# Patient Record
Sex: Male | Born: 1960 | Race: Black or African American | Hispanic: No | Smoking: Never smoker
Health system: Southern US, Community
[De-identification: ages and names within clinical notes are randomized; demographics above are authoritative.]

## PROBLEM LIST (undated history)

## (undated) DIAGNOSIS — K859 Acute pancreatitis without necrosis or infection, unspecified: Secondary | ICD-10-CM

## (undated) DIAGNOSIS — K219 Gastro-esophageal reflux disease without esophagitis: Secondary | ICD-10-CM

## (undated) HISTORY — DX: Acute pancreatitis without necrosis or infection, unspecified: K85.90

## (undated) HISTORY — DX: Gastro-esophageal reflux disease without esophagitis: K21.9

---

## 1996-12-06 HISTORY — PX: LEG SURGERY: SHX1003

## 2003-01-15 ENCOUNTER — Emergency Department (HOSPITAL_COMMUNITY): Admission: EM | Admit: 2003-01-15 | Discharge: 2003-01-15 | Payer: Self-pay | Admitting: Emergency Medicine

## 2005-01-07 ENCOUNTER — Ambulatory Visit: Payer: Self-pay | Admitting: Internal Medicine

## 2010-12-24 ENCOUNTER — Emergency Department (HOSPITAL_COMMUNITY)
Admission: EM | Admit: 2010-12-24 | Discharge: 2010-12-24 | Payer: Self-pay | Source: Home / Self Care | Admitting: Emergency Medicine

## 2010-12-28 ENCOUNTER — Emergency Department (HOSPITAL_COMMUNITY)
Admission: EM | Admit: 2010-12-28 | Discharge: 2010-12-28 | Payer: Self-pay | Source: Home / Self Care | Admitting: Emergency Medicine

## 2011-05-31 ENCOUNTER — Inpatient Hospital Stay (INDEPENDENT_AMBULATORY_CARE_PROVIDER_SITE_OTHER)
Admission: RE | Admit: 2011-05-31 | Discharge: 2011-05-31 | Disposition: A | Discharge status: Home / Self Care | Payer: No Typology Code available for payment source | Source: Ambulatory Visit | Attending: Family Medicine | Admitting: Family Medicine

## 2011-05-31 DIAGNOSIS — M545 Low back pain: Secondary | ICD-10-CM

## 2011-09-25 ENCOUNTER — Emergency Department (HOSPITAL_COMMUNITY)
Admission: EM | Admit: 2011-09-25 | Discharge: 2011-09-25 | Disposition: A | Discharge status: Home / Self Care | Payer: Self-pay | Attending: Emergency Medicine | Admitting: Emergency Medicine

## 2011-09-25 ENCOUNTER — Emergency Department (HOSPITAL_COMMUNITY): Payer: Self-pay

## 2011-09-25 DIAGNOSIS — M25519 Pain in unspecified shoulder: Secondary | ICD-10-CM | POA: Insufficient documentation

## 2011-09-25 DIAGNOSIS — M549 Dorsalgia, unspecified: Secondary | ICD-10-CM | POA: Insufficient documentation

## 2011-09-25 DIAGNOSIS — Y92410 Unspecified street and highway as the place of occurrence of the external cause: Secondary | ICD-10-CM | POA: Insufficient documentation

## 2011-09-25 DIAGNOSIS — T1490XA Injury, unspecified, initial encounter: Secondary | ICD-10-CM | POA: Insufficient documentation

## 2011-10-04 ENCOUNTER — Emergency Department (HOSPITAL_COMMUNITY)
Admission: EM | Admit: 2011-10-04 | Discharge: 2011-10-04 | Disposition: A | Discharge status: Home / Self Care | Payer: Self-pay | Attending: Emergency Medicine | Admitting: Emergency Medicine

## 2011-10-04 DIAGNOSIS — M25519 Pain in unspecified shoulder: Secondary | ICD-10-CM | POA: Insufficient documentation

## 2011-10-04 DIAGNOSIS — IMO0002 Reserved for concepts with insufficient information to code with codable children: Secondary | ICD-10-CM | POA: Insufficient documentation

## 2016-07-07 ENCOUNTER — Emergency Department (HOSPITAL_COMMUNITY)
Admission: EM | Admit: 2016-07-07 | Discharge: 2016-07-07 | Disposition: A | Discharge status: Home / Self Care | Payer: BLUE CROSS/BLUE SHIELD | Attending: Emergency Medicine | Admitting: Emergency Medicine

## 2016-07-07 ENCOUNTER — Emergency Department (HOSPITAL_COMMUNITY): Payer: BLUE CROSS/BLUE SHIELD

## 2016-07-07 ENCOUNTER — Encounter (HOSPITAL_COMMUNITY): Payer: Self-pay | Admitting: Emergency Medicine

## 2016-07-07 DIAGNOSIS — Y939 Activity, unspecified: Secondary | ICD-10-CM | POA: Diagnosis not present

## 2016-07-07 DIAGNOSIS — M542 Cervicalgia: Secondary | ICD-10-CM | POA: Diagnosis present

## 2016-07-07 DIAGNOSIS — Y999 Unspecified external cause status: Secondary | ICD-10-CM | POA: Diagnosis not present

## 2016-07-07 DIAGNOSIS — Y92410 Unspecified street and highway as the place of occurrence of the external cause: Secondary | ICD-10-CM | POA: Insufficient documentation

## 2016-07-07 MED ORDER — IBUPROFEN 600 MG PO TABS: 600.0000 mg | ORAL_TABLET | Freq: Four times a day (QID) | ORAL | 0 refills | Status: DC | PRN

## 2016-07-07 MED ORDER — METHOCARBAMOL 500 MG PO TABS: 500.0000 mg | ORAL_TABLET | Freq: Two times a day (BID) | ORAL | 0 refills | Status: DC | PRN
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
## 2019-09-08 ENCOUNTER — Inpatient Hospital Stay (HOSPITAL_COMMUNITY)
Admission: EM | Admit: 2019-09-08 | Discharge: 2019-09-11 | DRG: 871 | Disposition: A | Discharge status: Home / Self Care | Payer: BLUE CROSS/BLUE SHIELD | Attending: General Surgery | Admitting: General Surgery

## 2019-09-08 ENCOUNTER — Emergency Department (HOSPITAL_COMMUNITY): Payer: BLUE CROSS/BLUE SHIELD

## 2019-09-08 ENCOUNTER — Encounter (HOSPITAL_COMMUNITY): Payer: Self-pay | Admitting: *Deleted

## 2019-09-08 ENCOUNTER — Other Ambulatory Visit: Payer: Self-pay

## 2019-09-08 DIAGNOSIS — K3533 Acute appendicitis with perforation and localized peritonitis, with abscess: Secondary | ICD-10-CM | POA: Diagnosis present

## 2019-09-08 DIAGNOSIS — A419 Sepsis, unspecified organism: Secondary | ICD-10-CM | POA: Diagnosis present

## 2019-09-08 DIAGNOSIS — R030 Elevated blood-pressure reading, without diagnosis of hypertension: Secondary | ICD-10-CM | POA: Diagnosis present

## 2019-09-08 DIAGNOSIS — K3532 Acute appendicitis with perforation and localized peritonitis, without abscess: Secondary | ICD-10-CM | POA: Diagnosis present

## 2019-09-08 DIAGNOSIS — K651 Peritoneal abscess: Secondary | ICD-10-CM | POA: Diagnosis present

## 2019-09-08 DIAGNOSIS — Z20828 Contact with and (suspected) exposure to other viral communicable diseases: Secondary | ICD-10-CM | POA: Diagnosis present

## 2019-09-08 LAB — COMPREHENSIVE METABOLIC PANEL
ALT: 27 U/L (ref 0–44)
AST: 22 U/L (ref 15–41)
Albumin: 4.2 g/dL (ref 3.5–5.0)
Alkaline Phosphatase: 90 U/L (ref 38–126)
Anion gap: 14 (ref 5–15)
BUN: 11 mg/dL (ref 6–20)
CO2: 25 mmol/L (ref 22–32)
Calcium: 9.5 mg/dL (ref 8.9–10.3)
Chloride: 97 mmol/L — ABNORMAL LOW (ref 98–111)
Creatinine, Ser: 1.33 mg/dL — ABNORMAL HIGH (ref 0.61–1.24)
GFR calc Af Amer: >60 mL/min (ref >60)
GFR calc non Af Amer: 58 mL/min — ABNORMAL LOW (ref >60)
Glucose, Bld: 139 mg/dL — ABNORMAL HIGH (ref 70–99)
Potassium: 3.5 mmol/L (ref 3.5–5.1)
Sodium: 136 mmol/L (ref 135–145)
Total Bilirubin: 1.1 mg/dL (ref 0.3–1.2)
Total Protein: 8.4 g/dL — ABNORMAL HIGH (ref 6.5–8.1)

## 2019-09-08 LAB — SARS CORONAVIRUS 2 BY RT PCR (HOSPITAL ORDER, PERFORMED IN ~~LOC~~ HOSPITAL LAB): SARS Coronavirus 2: NEGATIVE

## 2019-09-08 LAB — CBC
HCT: 46.9 % (ref 39.0–52.0)
Hemoglobin: 14.9 g/dL (ref 13.0–17.0)
MCH: 29.9 pg (ref 26.0–34.0)
MCHC: 31.8 g/dL (ref 30.0–36.0)
MCV: 94 fL (ref 80.0–100.0)
Platelets: 422 10*3/uL — ABNORMAL HIGH (ref 150–400)
RBC: 4.99 MIL/uL (ref 4.22–5.81)
RDW: 12.5 % (ref 11.5–15.5)
WBC: 16 10*3/uL — ABNORMAL HIGH (ref 4.0–10.5)
nRBC: 0 % (ref 0.0–0.2)

## 2019-09-08 LAB — LIPASE, BLOOD: Lipase: 28 U/L (ref 11–51)

## 2019-09-08 MED ORDER — ONDANSETRON HCL 4 MG/2ML IJ SOLN
4.0000 mg | Freq: Four times a day (QID) | INTRAMUSCULAR | Status: DC | PRN
  Administered 2019-09-09: 4 mg via INTRAVENOUS
  Filled 2019-09-08: qty 2

## 2019-09-08 MED ORDER — MORPHINE SULFATE (PF) 4 MG/ML IV SOLN
4.0000 mg | Freq: Once | INTRAVENOUS | Status: AC
  Administered 2019-09-08: 17:00:00 4 mg via INTRAVENOUS
  Filled 2019-09-08: qty 1

## 2019-09-08 MED ORDER — ONDANSETRON 4 MG PO TBDP: 4.0000 mg | ORAL_TABLET | Freq: Four times a day (QID) | ORAL | Status: DC | PRN

## 2019-09-08 MED ORDER — LACTATED RINGERS IV BOLUS
1000.0000 mL | Freq: Once | INTRAVENOUS | Status: AC
  Administered 2019-09-08: 1000 mL via INTRAVENOUS

## 2019-09-08 MED ORDER — OXYCODONE HCL 5 MG PO TABS: 5.0000 mg | ORAL_TABLET | ORAL | Status: DC | PRN

## 2019-09-08 MED ORDER — ACETAMINOPHEN 325 MG PO TABS
650.0000 mg | ORAL_TABLET | Freq: Four times a day (QID) | ORAL | Status: DC | PRN
  Administered 2019-09-09 – 2019-09-10 (×4): 650 mg via ORAL
  Filled 2019-09-08 (×4): qty 2

## 2019-09-08 MED ORDER — DEXTROSE-NACL 5-0.45 % IV SOLN
INTRAVENOUS | Status: DC
  Administered 2019-09-08 – 2019-09-10 (×4): via INTRAVENOUS

## 2019-09-08 MED ORDER — SODIUM CHLORIDE 0.9% FLUSH: 3.0000 mL | Freq: Once | INTRAVENOUS | Status: DC

## 2019-09-08 MED ORDER — ONDANSETRON HCL 4 MG/2ML IJ SOLN
4.0000 mg | Freq: Once | INTRAMUSCULAR | Status: AC
  Administered 2019-09-08: 4 mg via INTRAVENOUS
  Filled 2019-09-08: qty 2

## 2019-09-08 MED ORDER — MORPHINE SULFATE (PF) 2 MG/ML IV SOLN
2.0000 mg | INTRAVENOUS | Status: DC | PRN
  Administered 2019-09-08 – 2019-09-09 (×4): 2 mg via INTRAVENOUS
  Filled 2019-09-08 (×3): qty 1

## 2019-09-08 MED ORDER — LACTATED RINGERS IV BOLUS
1000.0000 mL | Freq: Once | INTRAVENOUS | Status: AC
  Administered 2019-09-08: 19:00:00 1000 mL via INTRAVENOUS

## 2019-09-08 MED ORDER — LACTATED RINGERS IV SOLN
INTRAVENOUS | Status: DC
  Administered 2019-09-08 – 2019-09-10 (×2): via INTRAVENOUS

## 2019-09-08 MED ORDER — PIPERACILLIN-TAZOBACTAM 3.375 G IVPB
3.3750 g | Freq: Three times a day (TID) | INTRAVENOUS | Status: DC
  Administered 2019-09-09 – 2019-09-11 (×7): 3.375 g via INTRAVENOUS
  Filled 2019-09-08 (×7): qty 50

## 2019-09-08 MED ORDER — SODIUM CHLORIDE (PF) 0.9 % IJ SOLN
INTRAMUSCULAR | Status: AC
  Filled 2019-09-08: qty 50

## 2019-09-08 MED ORDER — ENOXAPARIN SODIUM 40 MG/0.4ML ~~LOC~~ SOLN
40.0000 mg | Freq: Every day | SUBCUTANEOUS | Status: DC
  Administered 2019-09-09 – 2019-09-10 (×3): 40 mg via SUBCUTANEOUS
  Filled 2019-09-08 (×3): qty 0.4

## 2019-09-08 MED ORDER — PIPERACILLIN-TAZOBACTAM 3.375 G IVPB 30 MIN
3.3750 g | Freq: Once | INTRAVENOUS | Status: AC
  Administered 2019-09-08: 21:00:00 3.375 g via INTRAVENOUS
  Filled 2019-09-08: qty 50

## 2019-09-08 MED ORDER — ACETAMINOPHEN 650 MG RE SUPP: 650.0000 mg | Freq: Four times a day (QID) | RECTAL | Status: DC | PRN

## 2019-09-08 MED ORDER — METOPROLOL TARTRATE 5 MG/5ML IV SOLN
5.0000 mg | Freq: Four times a day (QID) | INTRAVENOUS | Status: DC | PRN
  Administered 2019-09-08 – 2019-09-09 (×2): 5 mg via INTRAVENOUS
  Filled 2019-09-08 (×3): qty 5

## 2019-09-08 MED ORDER — ACETAMINOPHEN 500 MG PO TABS
1000.0000 mg | ORAL_TABLET | Freq: Once | ORAL | Status: AC
  Administered 2019-09-08: 1000 mg via ORAL
  Filled 2019-09-08: qty 2

## 2019-09-08 MED ORDER — DIPHENHYDRAMINE HCL 25 MG PO CAPS: 25.0000 mg | ORAL_CAPSULE | Freq: Four times a day (QID) | ORAL | Status: DC | PRN

## 2019-09-08 MED ORDER — DIPHENHYDRAMINE HCL 50 MG/ML IJ SOLN: 25.0000 mg | Freq: Four times a day (QID) | INTRAMUSCULAR | Status: DC | PRN

## 2019-09-08 MED ORDER — IOHEXOL 300 MG/ML  SOLN
100.0000 mL | Freq: Once | INTRAMUSCULAR | Status: AC | PRN
  Administered 2019-09-08: 100 mL via INTRAVENOUS

## 2019-09-08 MED ORDER — MORPHINE SULFATE (PF) 2 MG/ML IV SOLN
INTRAVENOUS | Status: AC
  Administered 2019-09-08: 21:00:00 2 mg via INTRAVENOUS
  Filled 2019-09-08: qty 1

## 2019-09-08 NOTE — Consult Note (Signed)
Reason for Consult:Appendicitis Referring Physician: Dr. Gwyneth SproutWhitney Plunkett  Darolyn RuaRickey Ewing is an 58 y.o. male.  HPI:   Mr. Charm BargesButler is a previously healthy 58 yo male who presented to the ED with a 4 day history of abdominal pain.  He notes that the pain began as generalized abdominal pain on Tuesday 09/04/2019.  He took one dose of ibuprofen on Tuesday, and notes that his pain improved with that treatment.  On Thursday, his abdominal pain became localized to the RLQ and became progressively worse, inhibiting his ability to sleep.  Associated symptoms included chills, nausea, and decreased appetite.  He denies vomiting, constipation or diarrhea.    ED workup demonstrated a elevated WBC of 16.  In addition, abdominal CT appreciated appendicoliths and perforated acute appendicitis with poorly marginated 7.5 x 4.9 x 4.3 cm periappendiceal abscess in RLQ.    History reviewed. No pertinent past medical history.  He has a history of orthopedic surgery to his left lower leg.  Past Surgical History:  Procedure Laterality Date   LEG SURGERY      No family history on file.  Social History:  reports that he has never smoked. He has never used smokeless tobacco. He reports current alcohol use. He reports that he does not use drugs.  Allergies: No Known Allergies  Medications: I have reviewed the patient's current medications.  Results for orders placed or performed during the hospital encounter of 09/08/19 (from the past 48 hour(s))  Lipase, blood     Status: None   Collection Time: 09/08/19  1:15 PM  Result Value Ref Range   Lipase 28 11 - 51 U/L    Comment: Performed at [facility], [address] [place], [place] [address]  Comprehensive metabolic panel     Status: Abnormal   Collection Time: 09/08/19  1:15 PM  Result Value Ref Range   Sodium 136 135 - 145 mmol/L   Potassium 3.5 3.5 - 5.1 mmol/L   Chloride 97 (L) 98 - 111 mmol/L   CO2 25 22 - 32 mmol/L   Glucose,  Bld 139 (H) 70 - 99 mg/dL   BUN 11 6 - 20 mg/dL   Creatinine, Ser 8.651.33 (H) 0.61 - 1.24 mg/dL   Calcium 9.5 8.9 - 78.410.3 mg/dL   Total Protein 8.4 (H) 6.5 - 8.1 g/dL   Albumin 4.2 3.5 - 5.0 g/dL   AST 22 15 - 41 U/L   ALT 27 0 - 44 U/L   Alkaline Phosphatase 90 38 - 126 U/L   Total Bilirubin 1.1 0.3 - 1.2 mg/dL   GFR calc non Af Amer 58 (L) >60 mL/min   GFR calc Af Amer >60 >60 mL/min   Anion gap 14 5 - 15    Comment: Performed at [facility], [address] [place], [place] [address]  CBC     Status: Abnormal   Collection Time: 09/08/19  1:15 PM  Result Value Ref Range   WBC 16.0 (H) 4.0 - 10.5 K/uL   RBC 4.99 4.22 - 5.81 MIL/uL   Hemoglobin 14.9 13.0 - 17.0 g/dL   HCT 52.846.9 41.339.0 - 24.452.0 %   MCV 94.0 80.0 - 100.0 fL   MCH 29.9 26.0 - 34.0 pg   MCHC 31.8 30.0 - 36.0 g/dL   RDW 01.012.5 27.211.5 - 53.615.5 %   Platelets 422 (H) 150 - 400 K/uL   nRBC 0.0 0.0 - 0.2 %    Comment: Performed at [facility], [address] [place], [place] [address]    Ct Abdomen Pelvis W Contrast  Result Date: 09/08/2019 CLINICAL DATA:  Right abdominal pain for several days. EXAM: CT ABDOMEN AND PELVIS WITH CONTRAST TECHNIQUE: Multidetector CT imaging of the abdomen and pelvis was performed using the standard protocol following bolus administration of intravenous contrast. CONTRAST:  OMNIPAQUE IOHEXOL 300 MG/ML  SOLN COMPARISON:  None. FINDINGS: Lower chest: No significant pulmonary nodules or acute consolidative airspace disease. Hepatobiliary: Normal liver size. No liver mass. Normal gallbladder with no radiopaque cholelithiasis. No biliary ductal dilatation. Pancreas: Normal, with no mass or duct dilation. Spleen: Normal size. No mass. Adrenals/Urinary Tract: Normal adrenals. Normal kidneys with no hydronephrosis and no renal mass. Normal bladder. Stomach/Bowel: Normal non-distended stomach. The appendix is dilated with diffuse appendiceal wall thickening and  hyperenhancement and prominent periappendiceal fat stranding, compatible with acute appendicitis. The tip of the appendix is completely indistinct with a 7.5 x 4.9 x 4.3 cm poorly marginated gas containing abscess surrounding the tip of the appendix in the right lower quadrant (series 2/image 52). There are two 6 mm calcified appendicoliths located within the periappendiceal abscess. There is reactive wall thickening in the distal ileum and cecum. No small bowel dilatation. No additional sites of small bowel wall thickening. Mild left colonic diverticulosis. Vascular/Lymphatic: Atherosclerotic nonaneurysmal abdominal aorta. Patent portal, splenic, hepatic and renal veins. No pathologically enlarged lymph nodes in the abdomen or pelvis. Reproductive: Mildly enlarged prostate. Other: No ascites. Musculoskeletal: No aggressive appearing focal osseous lesions. Moderate lumbar spondylosis. Partially visualized fixation hardware in the proximal left femur. IMPRESSION: 1. Perforated acute appendicitis with poorly marginated 7.5 x 4.9 x 4.3 cm gas-containing periappendiceal abscess in the right lower quadrant. Calcified extraluminal subcentimeter appendicoliths are present within the periappendiceal abscess. 2. Reactive wall thickening in the distal ileum and cecum. 3. Mildly enlarged prostate. 4.  Aortic Atherosclerosis (ICD10-I70.0). Electronically Signed   By: Delbert Phenix M.D.   On: 09/08/2019 20:11    Review of Systems  Constitutional: Positive for chills, fever and malaise/fatigue.  Gastrointestinal: Positive for abdominal pain (RLQ) and nausea. Negative for constipation, diarrhea and vomiting.  All other systems reviewed and are negative.  Blood pressure (!) 179/96, pulse (!) 125, temperature (!) 102.5 F (39.2 C), temperature source Oral, resp. rate 20, SpO2 97 %. Physical Exam  Constitutional: He is oriented to person, place, and time. He appears well-developed and well-nourished. No distress.    Cardiovascular: Normal rate, regular rhythm and normal heart sounds. Exam reveals no gallop and no friction rub.  No murmur heard. Respiratory: Effort normal and breath sounds normal. No respiratory distress. He has no wheezes. He has no rales.  GI: He exhibits distension. There is abdominal tenderness (RLQ).  Positive illeopsoas sign, negative rosvings sign  Neurological: He is alert and oriented to person, place, and time.  Skin: Skin is warm and dry.  Psychiatric: He has a normal mood and affect. His behavior is normal. Judgment and thought content normal.      Assessment/Plan: Mr. Hamada is a 58 yo previously healthy male who presented to the ED with a 4 day history of abdominal pain progressing from generalized pain to RLQ pain, nausea, fever and chills. CBC significant for elevated WBC, and CT demonstrates appendicoliths and acute appendicitis with perforation and periappendicular abscess.    - Admit patient to hospital for IV antibiotics.  - Consider consulting IR for drainage of abscess.  - Consider future appendectomy in a few months after  acute infection has resolved to prevent recurrence of acute appendicitis.    Suzanna Obey 09/08/2019, 8:43 PM

## 2019-09-08 NOTE — ED Triage Notes (Signed)
Pt went to UC with rt sided abd pain since Tuesday, was told to come here for a scan. ? appendicitis

## 2019-09-08 NOTE — ED Provider Notes (Signed)
[facility]-EMERGENCY DEPT Provider Note   CSN: [id number] Arrival date & time: 09/08/19  1204     History   Chief Complaint Chief Complaint  Patient presents with   Abdominal Pain    HPI Jeremy Ewing is a 58 y.o. male.     The history is provided by the patient.  Abdominal Pain Pain location:  RLQ Pain quality: aching, cramping, sharp, shooting and stabbing   Pain radiates to:  Does not radiate Pain severity:  Severe Onset quality:  Gradual Duration:  5 days Timing:  Constant Progression:  Worsening Chronicity:  New Context comment:  Started gradually on tuesday all over and then since thursday has localized to the RLQ Relieved by:  Nothing Ineffective treatments:  Movement and palpation Associated symptoms: anorexia   Associated symptoms: no cough, no diarrhea, no dysuria, no fever, no nausea and no vomiting   Risk factors comment:  No known medical problems.  takes no meds   History reviewed. No pertinent past medical history.  There are no active problems to display for this patient.   Past Surgical History:  Procedure Laterality Date   LEG SURGERY          Home Medications    Prior to Admission medications   Medication Sig Start Date End Date Taking? Authorizing Provider  ibuprofen (ADVIL,MOTRIN) 600 MG tablet Take 1 tablet (600 mg total) by mouth every 6 (six) hours as needed. 07/07/16  Yes Ward, Chase PicketJaime Pilcher, PA-C  methocarbamol (ROBAXIN) 500 MG tablet Take 1 tablet (500 mg total) by mouth 2 (two) times daily as needed for muscle spasms. Patient not taking: Reported on 09/08/2019 07/07/16   Ward, Chase PicketJaime Pilcher, PA-C    Family History No family history on file.  Social History Social History   Tobacco Use   Smoking status: Never Smoker   Smokeless tobacco: Never Used  Substance Use Topics   Alcohol use: Yes   Drug use: Never     Allergies   Patient has no known allergies.   Review of Systems Review of  Systems  Constitutional: Negative for fever.  Respiratory: Negative for cough.   Gastrointestinal: Positive for abdominal pain and anorexia. Negative for diarrhea, nausea and vomiting.  Genitourinary: Negative for dysuria.     Physical Exam Updated Vital Signs BP (!) 151/98 (BP Location: Left Arm)    Pulse (!) 116    Temp 98.4 F (36.9 C) (Oral)    Resp 18    SpO2 98%   Physical Exam Vitals signs and nursing note reviewed.  Constitutional:      General: He is not in acute distress.    Appearance: He is well-developed.  HENT:     Head: Normocephalic and atraumatic.  Eyes:     Conjunctiva/sclera: Conjunctivae normal.     Pupils: Pupils are equal, round, and reactive to light.  Neck:     Musculoskeletal: Normal range of motion and neck supple.  Cardiovascular:     Rate and Rhythm: Regular rhythm. Tachycardia present.     Pulses: Normal pulses.     Heart sounds: No murmur.  Pulmonary:     Effort: Pulmonary effort is normal. No respiratory distress.     Breath sounds: Normal breath sounds. No wheezing or rales.  Abdominal:     General: Bowel sounds are normal. There is no distension.     Palpations: Abdomen is soft.     Tenderness: There is abdominal tenderness in the right lower quadrant. There is guarding  and rebound. There is no right CVA tenderness or left CVA tenderness. Positive signs include psoas sign.     Hernia: No hernia is present.  Musculoskeletal: Normal range of motion.        General: No tenderness.  Skin:    General: Skin is warm and dry.     Findings: No erythema or rash.  Neurological:     Mental Status: He is alert and oriented to person, place, and time.  Psychiatric:        Behavior: Behavior normal.      ED Treatments / Results  Labs (all labs ordered are listed, but only abnormal results are displayed) Labs Reviewed  COMPREHENSIVE METABOLIC PANEL - Abnormal; Notable for the following components:      Result Value   Chloride 97 (*)     Glucose, Bld 139 (*)    Creatinine, Ser 1.33 (*)    Total Protein 8.4 (*)    GFR calc non Af Amer 58 (*)    All other components within normal limits  CBC - Abnormal; Notable for the following components:   WBC 16.0 (*)    Platelets 422 (*)    All other components within normal limits  SARS CORONAVIRUS 2 (HOSPITAL ORDER, [place] [facility])  LIPASE, BLOOD  URINALYSIS, ROUTINE W REFLEX MICROSCOPIC    EKG None  Radiology Ct Abdomen Pelvis W Contrast  Result Date: 09/08/2019 CLINICAL DATA:  Right abdominal pain for several days. EXAM: CT ABDOMEN AND PELVIS WITH CONTRAST TECHNIQUE: Multidetector CT imaging of the abdomen and pelvis was performed using the standard protocol following bolus administration of intravenous contrast. CONTRAST:  148mL OMNIPAQUE IOHEXOL 300 MG/ML  SOLN COMPARISON:  None. FINDINGS: Lower chest: No significant pulmonary nodules or acute consolidative airspace disease. Hepatobiliary: Normal liver size. No liver mass. Normal gallbladder with no radiopaque cholelithiasis. No biliary ductal dilatation. Pancreas: Normal, with no mass or duct dilation. Spleen: Normal size. No mass. Adrenals/Urinary Tract: Normal adrenals. Normal kidneys with no hydronephrosis and no renal mass. Normal bladder. Stomach/Bowel: Normal non-distended stomach. The appendix is dilated with diffuse appendiceal wall thickening and hyperenhancement and prominent periappendiceal fat stranding, compatible with acute appendicitis. The tip of the appendix is completely indistinct with a 7.5 x 4.9 x 4.3 cm poorly marginated gas containing abscess surrounding the tip of the appendix in the right lower quadrant (series 2/image 52). There are two 6 mm calcified appendicoliths located within the periappendiceal abscess. There is reactive wall thickening in the distal ileum and cecum. No small bowel dilatation. No additional sites of small bowel wall thickening. Mild left colonic diverticulosis.  Vascular/Lymphatic: Atherosclerotic nonaneurysmal abdominal aorta. Patent portal, splenic, hepatic and renal veins. No pathologically enlarged lymph nodes in the abdomen or pelvis. Reproductive: Mildly enlarged prostate. Other: No ascites. Musculoskeletal: No aggressive appearing focal osseous lesions. Moderate lumbar spondylosis. Partially visualized fixation hardware in the proximal left femur. IMPRESSION: 1. Perforated acute appendicitis with poorly marginated 7.5 x 4.9 x 4.3 cm gas-containing periappendiceal abscess in the right lower quadrant. Calcified extraluminal subcentimeter appendicoliths are present within the periappendiceal abscess. 2. Reactive wall thickening in the distal ileum and cecum. 3. Mildly enlarged prostate. 4.  Aortic Atherosclerosis (ICD10-I70.0). Electronically Signed   By: Ilona Sorrel M.D.   On: 09/08/2019 20:11    Procedures Procedures (including critical care time)  Medications Ordered in ED Medications  sodium chloride flush (NS) 0.9 % injection 3 mL (has no administration in time range)  morphine 4 MG/ML injection  4 mg (has no administration in time range)  ondansetron (ZOFRAN) injection 4 mg (has no administration in time range)  lactated ringers bolus 1,000 mL (has no administration in time range)  lactated ringers infusion (has no administration in time range)     Initial Impression / Assessment and Plan / ED Course  I have reviewed the triage vital signs and the nursing notes.  Pertinent labs & imaging results that were available during my care of the patient were reviewed by me and considered in my medical decision making (see chart for details).       Patient is a 58 year old male presenting with 5 days of worsening right lower quadrant pain.  Patient has a positive psoas sign, rebound, guarding in the right lower quadrant.  He is mildly tachycardic at 116 and mildly hypertensive.  Patient dates he has no medical problems and takes no medications.  He  denies any urinary changes and he has had no fever.  He has no back pain and lower suspicion for kidney stone.  Concern for appendicitis at this time versus atypical diverticulitis.  Patient given IV fluids, pain medication and CT scan is pending.  White count of 16,000 today with creatinine of 1.3 otherwise labs are unremarkable.  8:20 PM CT is consistent with appendicitis with wall thickening and abscess present concerning for perforated appendicitis.  Will consult general surgery.  Patient given Zosyn.  COVID is pending.  Final Clinical Impressions(s) / ED Diagnoses   Final diagnoses:  Acute appendicitis with perforation, localized peritonitis, and abscess, without gangrene    ED Discharge Orders    None       Gwyneth Sprout, MD 09/08/19 2021

## 2019-09-09 ENCOUNTER — Inpatient Hospital Stay (HOSPITAL_COMMUNITY): Payer: BLUE CROSS/BLUE SHIELD

## 2019-09-09 ENCOUNTER — Encounter (HOSPITAL_COMMUNITY): Payer: Self-pay | Admitting: Radiology

## 2019-09-09 LAB — URINALYSIS, ROUTINE W REFLEX MICROSCOPIC
Bacteria, UA: NONE SEEN
Bilirubin Urine: NEGATIVE
Glucose, UA: NEGATIVE mg/dL
Ketones, ur: NEGATIVE mg/dL
Leukocytes,Ua: NEGATIVE
Nitrite: NEGATIVE
Protein, ur: NEGATIVE mg/dL
Specific Gravity, Urine: 1.041 — ABNORMAL HIGH (ref 1.005–1.030)
pH: 7 (ref 5.0–8.0)

## 2019-09-09 LAB — CBC
HCT: 41 % (ref 39.0–52.0)
Hemoglobin: 13.1 g/dL (ref 13.0–17.0)
MCH: 30.2 pg (ref 26.0–34.0)
MCHC: 32 g/dL (ref 30.0–36.0)
MCV: 94.5 fL (ref 80.0–100.0)
Platelets: 373 10*3/uL (ref 150–400)
RBC: 4.34 MIL/uL (ref 4.22–5.81)
RDW: 12.9 % (ref 11.5–15.5)
WBC: 18.1 10*3/uL — ABNORMAL HIGH (ref 4.0–10.5)
nRBC: 0 % (ref 0.0–0.2)

## 2019-09-09 LAB — COMPREHENSIVE METABOLIC PANEL
ALT: 23 U/L (ref 0–44)
AST: 17 U/L (ref 15–41)
Albumin: 3.3 g/dL — ABNORMAL LOW (ref 3.5–5.0)
Alkaline Phosphatase: 75 U/L (ref 38–126)
Anion gap: 10 (ref 5–15)
BUN: 9 mg/dL (ref 6–20)
CO2: 25 mmol/L (ref 22–32)
Calcium: 8.8 mg/dL — ABNORMAL LOW (ref 8.9–10.3)
Chloride: 103 mmol/L (ref 98–111)
Creatinine, Ser: 1.38 mg/dL — ABNORMAL HIGH (ref 0.61–1.24)
GFR calc Af Amer: >60 mL/min (ref >60)
GFR calc non Af Amer: 56 mL/min — ABNORMAL LOW (ref >60)
Glucose, Bld: 131 mg/dL — ABNORMAL HIGH (ref 70–99)
Potassium: 3.3 mmol/L — ABNORMAL LOW (ref 3.5–5.1)
Sodium: 138 mmol/L (ref 135–145)
Total Bilirubin: 1 mg/dL (ref 0.3–1.2)
Total Protein: 6.9 g/dL (ref 6.5–8.1)

## 2019-09-09 LAB — PROTIME-INR
INR: 1.1 (ref 0.8–1.2)
Prothrombin Time: 14.4 seconds (ref 11.4–15.2)

## 2019-09-09 LAB — HIV ANTIBODY (ROUTINE TESTING W REFLEX): HIV Screen 4th Generation wRfx: NONREACTIVE

## 2019-09-09 MED ORDER — SODIUM CHLORIDE 0.9% FLUSH
5.0000 mL | Freq: Three times a day (TID) | INTRAVENOUS | Status: DC
  Administered 2019-09-09 – 2019-09-11 (×6): 5 mL

## 2019-09-09 MED ORDER — MIDAZOLAM HCL 2 MG/2ML IJ SOLN
INTRAMUSCULAR | Status: AC
  Filled 2019-09-09: qty 6

## 2019-09-09 MED ORDER — FENTANYL CITRATE (PF) 100 MCG/2ML IJ SOLN
INTRAMUSCULAR | Status: AC
  Filled 2019-09-09: qty 4

## 2019-09-09 MED ORDER — FENTANYL CITRATE (PF) 100 MCG/2ML IJ SOLN
INTRAMUSCULAR | Status: AC | PRN
  Administered 2019-09-09: 50 ug via INTRAVENOUS

## 2019-09-09 MED ORDER — METOPROLOL TARTRATE 5 MG/5ML IV SOLN
5.0000 mg | Freq: Once | INTRAVENOUS | Status: AC
  Administered 2019-09-09: 5 mg via INTRAVENOUS

## 2019-09-09 MED ORDER — MIDAZOLAM HCL 2 MG/2ML IJ SOLN
INTRAMUSCULAR | Status: AC | PRN
  Administered 2019-09-09: 1 mg via INTRAVENOUS

## 2019-09-09 MED ORDER — SODIUM CHLORIDE 0.9 % IV SOLN
INTRAVENOUS | Status: DC | PRN
  Administered 2019-09-09: 250 mL via INTRAVENOUS

## 2019-09-09 NOTE — Consult Note (Signed)
Chief Complaint: Patient was seen in consultation today for CT-guided drainage of periappendiceal abscess Chief Complaint  Patient presents with   Abdominal Pain    Referring Physician(s): Kinsinger,L  Supervising Physician: Gilmer Mor  Patient Status: [facility] - In-pt  History of Present Illness: Jeremy Ewing is a 58 y.o. male who presented to [facility] on 09/08/19 with 4-day history of right lower quadrant pain along with fever and leukocytosis.  Subsequent imaging revealed perforated acute appendicitis with periappendiceal abscess.  Request now received from surgery for CT-guided drainage of the abscess.  He is COVID-19 negative.  Current labs include WBC 18.1, hemoglobin 13.1, platelets 373K, creatinine 1.38, PT 10.4, INR 1.1.  History reviewed. No pertinent past medical history.  Past Surgical History:  Procedure Laterality Date   LEG SURGERY      Allergies: Patient has no known allergies.  Medications: Prior to Admission medications   Medication Sig Start Date End Date Taking? Authorizing Provider  ibuprofen (ADVIL,MOTRIN) 600 MG tablet Take 1 tablet (600 mg total) by mouth every 6 (six) hours as needed. 07/07/16  Yes Ward, Chase Picket, PA-C  methocarbamol (ROBAXIN) 500 MG tablet Take 1 tablet (500 mg total) by mouth 2 (two) times daily as needed for muscle spasms. Patient not taking: Reported on 09/08/2019 07/07/16   Ward, Chase Picket, PA-C     No family history on file.  Social History   Socioeconomic History   Marital status: Married    Spouse name: Not on file   Number of children: Not on file   Years of education: Not on file   Highest education level: Not on file  Occupational History   Not on file  Social Needs   Financial resource strain: Not on file   Food insecurity    Worry: Not on file    Inability: Not on file   Transportation needs    Medical: Not on file    Non-medical: Not on file  Tobacco Use   Smoking status:  Never Smoker   Smokeless tobacco: Never Used  Substance and Sexual Activity   Alcohol use: Yes   Drug use: Never   Sexual activity: Not on file  Lifestyle   Physical activity    Days per week: Not on file    Minutes per session: Not on file   Stress: Not on file  Relationships   Social connections    Talks on phone: Not on file    Gets together: Not on file    Attends religious service: Not on file    Active member of club or organization: Not on file    Attends meetings of clubs or organizations: Not on file    Relationship status: Not on file  Other Topics Concern   Not on file  Social History Narrative   Not on file     Review of Systems currently denies fever, headache, chest pain, dyspnea, cough, back pain, nausea, vomiting or bleeding.  He does have persistent right lower quadrant pain.  Vital Signs: BP 122/77 (BP Location: Left Arm)    Pulse (!) 103    Temp 100.2 F (37.9 C) (Oral)    Resp 18    Ht 6' (1.829 m)    Wt 218 lb 8 oz (99.1 kg)    SpO2 98%    BMI 29.63 kg/m   Physical Exam awake, alert.  Chest clear to auscultation bilaterally.  Heart with tachycardic but regular rhythm.  Abdomen soft, slightly distended, few bowel  sounds, moderately tender right lower quadrant to palpation.  No lower extremity edema.  Imaging: Ct Abdomen Pelvis W Contrast  Result Date: 09/08/2019 CLINICAL DATA:  Right abdominal pain for several days. EXAM: CT ABDOMEN AND PELVIS WITH CONTRAST TECHNIQUE: Multidetector CT imaging of the abdomen and pelvis was performed using the standard protocol following bolus administration of intravenous contrast. CONTRAST:  100mL OMNIPAQUE IOHEXOL 300 MG/ML  SOLN COMPARISON:  None. FINDINGS: Lower chest: No significant pulmonary nodules or acute consolidative airspace disease. Hepatobiliary: Normal liver size. No liver mass. Normal gallbladder with no radiopaque cholelithiasis. No biliary ductal dilatation. Pancreas: Normal, with no mass or duct  dilation. Spleen: Normal size. No mass. Adrenals/Urinary Tract: Normal adrenals. Normal kidneys with no hydronephrosis and no renal mass. Normal bladder. Stomach/Bowel: Normal non-distended stomach. The appendix is dilated with diffuse appendiceal wall thickening and hyperenhancement and prominent periappendiceal fat stranding, compatible with acute appendicitis. The tip of the appendix is completely indistinct with a 7.5 x 4.9 x 4.3 cm poorly marginated gas containing abscess surrounding the tip of the appendix in the right lower quadrant (series 2/image 52). There are two 6 mm calcified appendicoliths located within the periappendiceal abscess. There is reactive wall thickening in the distal ileum and cecum. No small bowel dilatation. No additional sites of small bowel wall thickening. Mild left colonic diverticulosis. Vascular/Lymphatic: Atherosclerotic nonaneurysmal abdominal aorta. Patent portal, splenic, hepatic and renal veins. No pathologically enlarged lymph nodes in the abdomen or pelvis. Reproductive: Mildly enlarged prostate. Other: No ascites. Musculoskeletal: No aggressive appearing focal osseous lesions. Moderate lumbar spondylosis. Partially visualized fixation hardware in the proximal left femur. IMPRESSION: 1. Perforated acute appendicitis with poorly marginated 7.5 x 4.9 x 4.3 cm gas-containing periappendiceal abscess in the right lower quadrant. Calcified extraluminal subcentimeter appendicoliths are present within the periappendiceal abscess. 2. Reactive wall thickening in the distal ileum and cecum. 3. Mildly enlarged prostate. 4.  Aortic Atherosclerosis (ICD10-I70.0). Electronically Signed   By: Delbert PhenixJason A Poff M.D.   On: 09/08/2019 20:11    Labs:  CBC: Recent Labs    09/08/19 1315 09/09/19 0351  WBC 16.0* 18.1*  HGB 14.9 13.1  HCT 46.9 41.0  PLT 422* 373    COAGS: Recent Labs    09/09/19 0918  INR 1.1    BMP: Recent Labs    09/08/19 1315 09/09/19 0351  NA 136 138  K  3.5 3.3*  CL 97* 103  CO2 25 25  GLUCOSE 139* 131*  BUN 11 9  CALCIUM 9.5 8.8*  CREATININE 1.33* 1.38*  GFRNONAA 58* 56*  GFRAA >60 >60    LIVER FUNCTION TESTS: Recent Labs    09/08/19 1315 09/09/19 0351  BILITOT 1.1 1.0  AST 22 17  ALT 27 23  ALKPHOS 90 75  PROT 8.4* 6.9  ALBUMIN 4.2 3.3*    TUMOR MARKERS: No results for input(s): AFPTM, CEA, CA199, CHROMGRNA in the last 8760 hours.  Assessment and Plan: 58 y.o. male [facility] with 4-day history of right lower quadrant pain along with fever/chills, nausea and leukocytosis.  Subsequent imaging revealed perforated acute appendicitis with periappendiceal abscess.  Request now received from surgery for CT-guided drainage of the abscess.  He is COVID-19 negative.  Current labs include WBC 18.1, hemoglobin 13.1, platelets 373K, creatinine 1.38, PT 10.4, INR 1.1.  Imaging studies have been reviewed by Dr. Loreta AveWagner. Risks and benefits discussed with the patient including bleeding, infection, damage to adjacent structures, bowel perforation/fistula connection, and sepsis.  All  of the patient's questions were answered, patient is agreeable to proceed. Consent signed and in chart.  Procedure scheduled for today   Thank you for this interesting consult.  I greatly enjoyed meeting Zakkary Thibault and look forward to participating in their care.  A copy of this report was sent to the requesting provider on this date.  Electronically Signed: D. Rowe Robert, PA-C 09/09/2019, 11:21 AM   I spent a total of 25 minutes in face to face in clinical consultation, greater than 50% of which was counseling/coordinating care for CT-guided drainage of periappendiceal abscess

## 2019-09-09 NOTE — Progress Notes (Signed)
   Vital Signs MEWS/VS Documentation      09/09/2019 1248 09/09/2019 1249 09/09/2019 1250 09/09/2019 1309   MEWS Score:  3  4  4  5    MEWS Score Color:  Yellow  Red  Red  Red   Resp:  (!) 22  -  (!) 21  16   Pulse:  (!) 122  -  (!) 122  (!) 124   BP:  140/81  -  135/81  139/83   Temp:  -  -  -  (!) 102.9 F (39.4 C)   O2 Device:  Room Air  -  -  Room Air   Level of Consciousness:  -  Responds to Voice  -  -     Pt here being treated for sepsis dt perforated appendix with abscess.   Patient is getting IV fluids and antibiotics.  Patient given PO tylenol for fever and IV metoprolol for increased HR. Will continue to monitor.       Cristiana Yochim A 09/09/2019,1:18 PM

## 2019-09-09 NOTE — Procedures (Signed)
Interventional Radiology Procedure Note  Procedure: CT gudied drain placement of appendiceal abscess.  71F drain. .  Complications: None Recommendations:  - TO bulb suction. - Do not submerge - Routine drain care   Signed,  Dulcy Fanny. Earleen Newport, DO

## 2019-09-09 NOTE — Progress Notes (Signed)
Progress Note: General Surgery Service   Chief Complaint/Subjective: Jeremy Ewing reports that he is continuing to have RLQ pain this morning, but reports he is feeling "some better."  He reports that his nausea has improved, and he was able to get sleep overnight.  At one point overnight, patient developed a fever of 103 F, and became tachycardic and hypertensive.  He now has a fever of 100.2, and heart rate of 103 bpm, all other vitals in normal range.    Objective: Vital signs in last 24 hours: Temp:  [98.4 F (36.9 C)-103 F (39.4 C)] 100.2 F (37.9 C) (10/04 0631) Pulse Rate:  [53-131] 103 (10/04 0631) Resp:  [10-23] 18 (10/04 0631) BP: (122-179)/(77-110) 122/77 (10/04 0631) SpO2:  [93 %-100 %] 98 % (10/04 0631)    Intake/Output from previous day: No intake/output data recorded. Intake/Output this shift: No intake/output data recorded.  Lungs: Clear to auscultation bilaterally  Cardiovascular: Normal cardiac rate and rhythm, no m/r/g  Abd: Normoactive bowel sounds in all 4 quadrants.  Abdominal pain to palpation in RLQ and RUQ.  Abdomen continues to be distended  Extremities: Dorsalis pedis pulses intact bilaterally, no LE edema  Neuro: Alert and oriented x3  Lab Results: CBC  Recent Labs    09/08/19 1315 09/09/19 0351  WBC 16.0* 18.1*  HGB 14.9 13.1  HCT 46.9 41.0  PLT 422* 373   BMET Recent Labs    09/08/19 1315 09/09/19 0351  NA 136 138  K 3.5 3.3*  CL 97* 103  CO2 25 25  GLUCOSE 139* 131*  BUN 11 9  CREATININE 1.33* 1.38*  CALCIUM 9.5 8.8*   PT/INR No results for input(s): LABPROT, INR in the last 72 hours. ABG No results for input(s): PHART, HCO3 in the last 72 hours.  Invalid input(s): PCO2, PO2  Studies/Results:  Anti-infectives: Anti-infectives (From admission, onward)   Start     Dose/Rate Route Frequency Ordered Stop   09/09/19 0200  piperacillin-tazobactam (ZOSYN) IVPB 3.375 g     3.375 g 12.5 mL/hr over 240 Minutes Intravenous  Every 8 hours 09/08/19 2116     09/08/19 2030  piperacillin-tazobactam (ZOSYN) IVPB 3.375 g     3.375 g 100 mL/hr over 30 Minutes Intravenous  Once 09/08/19 2020 09/08/19 2109      Medications: Scheduled Meds: . enoxaparin (LOVENOX) injection  40 mg Subcutaneous QHS  . sodium chloride flush  3 mL Intravenous Once   Continuous Infusions: . dextrose 5 % and 0.45% NaCl 100 mL/hr at 09/08/19 2127  . lactated ringers Stopped (09/08/19 2128)  . piperacillin-tazobactam (ZOSYN)  IV 3.375 g (09/09/19 0130)   PRN Meds:.acetaminophen **OR** acetaminophen, diphenhydrAMINE **OR** diphenhydrAMINE, metoprolol tartrate, morphine injection, ondansetron **OR** ondansetron (ZOFRAN) IV, oxyCODONE  Assessment/Plan: Patient Active Problem List   Diagnosis Date Noted  . Perforated appendicitis 09/08/2019    Patient is a 58 yo previously healthy male admitted yesterday for acute appendicitis with perforation and periappendiceal abscess of 7.5 cm x 4.9cm x 4.3 cm.  His pain, nausea, and vomiting have improved with treatment.  He continues to exhibit fever and tachycardia.  WBC continues to be elevated at 18.1 today.  Contacted interventional radiology for consult today to discuss drainage of abscess ID: Zosyn 3.375 g Q8hrs VTE: encouraged patient to ambulate around unit, Lovenox 40 mg Diet: NPO    LOS: 1 day   Kandice Robinsons Pg# [phone] [facility]

## 2019-09-09 NOTE — Progress Notes (Signed)
[facility]  answering service was notified because vitals were: 103F;HR124;RR20;145/88 (MAP 104);93 % Room Air.

## 2019-09-10 LAB — CBC
HCT: 40.7 % (ref 39.0–52.0)
Hemoglobin: 12.8 g/dL — ABNORMAL LOW (ref 13.0–17.0)
MCH: 30.6 pg (ref 26.0–34.0)
MCHC: 31.4 g/dL (ref 30.0–36.0)
MCV: 97.4 fL (ref 80.0–100.0)
Platelets: 348 10*3/uL (ref 150–400)
RBC: 4.18 MIL/uL — ABNORMAL LOW (ref 4.22–5.81)
RDW: 13.2 % (ref 11.5–15.5)
WBC: 20.5 10*3/uL — ABNORMAL HIGH (ref 4.0–10.5)
nRBC: 0 % (ref 0.0–0.2)

## 2019-09-10 LAB — COMPREHENSIVE METABOLIC PANEL
ALT: 23 U/L (ref 0–44)
AST: 19 U/L (ref 15–41)
Albumin: 2.9 g/dL — ABNORMAL LOW (ref 3.5–5.0)
Alkaline Phosphatase: 70 U/L (ref 38–126)
Anion gap: 10 (ref 5–15)
BUN: 10 mg/dL (ref 6–20)
CO2: 25 mmol/L (ref 22–32)
Calcium: 8.4 mg/dL — ABNORMAL LOW (ref 8.9–10.3)
Chloride: 104 mmol/L (ref 98–111)
Creatinine, Ser: 1.45 mg/dL — ABNORMAL HIGH (ref 0.61–1.24)
GFR calc Af Amer: >60 mL/min (ref >60)
GFR calc non Af Amer: 52 mL/min — ABNORMAL LOW (ref >60)
Glucose, Bld: 171 mg/dL — ABNORMAL HIGH (ref 70–99)
Potassium: 3.3 mmol/L — ABNORMAL LOW (ref 3.5–5.1)
Sodium: 139 mmol/L (ref 135–145)
Total Bilirubin: 0.7 mg/dL (ref 0.3–1.2)
Total Protein: 6.7 g/dL (ref 6.5–8.1)

## 2019-09-10 MED ORDER — LIP MEDEX EX OINT
TOPICAL_OINTMENT | CUTANEOUS | Status: AC
  Filled 2019-09-10: qty 7

## 2019-09-10 MED ORDER — POTASSIUM CHLORIDE CRYS ER 20 MEQ PO TBCR
30.0000 meq | EXTENDED_RELEASE_TABLET | Freq: Two times a day (BID) | ORAL | Status: AC
  Administered 2019-09-10 (×2): 30 meq via ORAL
  Filled 2019-09-10 (×2): qty 1

## 2019-09-10 MED ORDER — OXYCODONE HCL 5 MG PO TABS
5.0000 mg | ORAL_TABLET | ORAL | Status: DC | PRN
  Administered 2019-09-10: 5 mg via ORAL
  Administered 2019-09-11: 10 mg via ORAL
  Filled 2019-09-10 (×3): qty 1

## 2019-09-10 MED ORDER — MORPHINE SULFATE (PF) 2 MG/ML IV SOLN: 2.0000 mg | INTRAVENOUS | Status: DC | PRN

## 2019-09-10 NOTE — Progress Notes (Signed)
Subjective: CC: Abdominal pain Patient with fever of 102.4 yesterday. He is s/p IR drain placement. Reports that his pain has significantly improved. He is sitting up in bed watching [employer]. He describes his pain has mild and located in the RLQ. No N/V. He is passing flatus. Last BM 10/2.   Objective: Vital signs in last 24 hours: Temp:  [98.5 F (36.9 C)-102.9 F (39.4 C)] 99.4 F (37.4 C) (10/05 0532) Pulse Rate:  [97-124] 105 (10/05 0532) Resp:  [16-23] 16 (10/05 0532) BP: (110-149)/(43-108) 136/89 (10/05 0532) SpO2:  [89 %-100 %] 97 % (10/05 0532) Last BM Date: 09/07/19  Intake/Output from previous day: 10/04 0701 - 10/05 0700 In: 2421 [P.O.:60; I.V.:2251.1; IV Piggyback:99.9] Out: 1240 [Urine:1200; Drains:40] Intake/Output this shift: No intake/output data recorded.  PE: Gen:  Alert, NAD, pleasant Card:  RRR Pulm:  CTA b/l, normal rate and effort  Abd: Soft, protuberant, tenderness of the RLQ without r/r/g. No peritonitis. +BS. RLQ with 40cc/24 hours red/brown fluid with some debris.  Ext:  No LE edema Psych: A&Ox3  Skin: no rashes noted, warm and dry  Lab Results:  Recent Labs    09/09/19 0351 09/10/19 0503  WBC 18.1* 20.5*  HGB 13.1 12.8*  HCT 41.0 40.7  PLT 373 348   BMET Recent Labs    09/09/19 0351 09/10/19 0503  NA 138 139  K 3.3* 3.3*  CL 103 104  CO2 25 25  GLUCOSE 131* 171*  BUN 9 10  CREATININE 1.38* 1.45*  CALCIUM 8.8* 8.4*   PT/INR Recent Labs    09/09/19 0918  LABPROT 14.4  INR 1.1   CMP     Component Value Date/Time   NA 139 09/10/2019 0503   K 3.3 (L) 09/10/2019 0503   CL 104 09/10/2019 0503   CO2 25 09/10/2019 0503   GLUCOSE 171 (H) 09/10/2019 0503   BUN 10 09/10/2019 0503   CREATININE 1.45 (H) 09/10/2019 0503   CALCIUM 8.4 (L) 09/10/2019 0503   PROT 6.7 09/10/2019 0503   ALBUMIN 2.9 (L) 09/10/2019 0503   AST 19 09/10/2019 0503   ALT 23 09/10/2019 0503   ALKPHOS 70 09/10/2019 0503   BILITOT 0.7  09/10/2019 0503   GFRNONAA 52 (L) 09/10/2019 0503   GFRAA >60 09/10/2019 0503   Lipase     Component Value Date/Time   LIPASE 28 09/08/2019 1315       Studies/Results: Ct Abdomen Pelvis W Contrast  Result Date: 09/08/2019 CLINICAL DATA:  Right abdominal pain for several days. EXAM: CT ABDOMEN AND PELVIS WITH CONTRAST TECHNIQUE: Multidetector CT imaging of the abdomen and pelvis was performed using the standard protocol following bolus administration of intravenous contrast. CONTRAST:  157mL OMNIPAQUE IOHEXOL 300 MG/ML  SOLN COMPARISON:  None. FINDINGS: Lower chest: No significant pulmonary nodules or acute consolidative airspace disease. Hepatobiliary: Normal liver size. No liver mass. Normal gallbladder with no radiopaque cholelithiasis. No biliary ductal dilatation. Pancreas: Normal, with no mass or duct dilation. Spleen: Normal size. No mass. Adrenals/Urinary Tract: Normal adrenals. Normal kidneys with no hydronephrosis and no renal mass. Normal bladder. Stomach/Bowel: Normal non-distended stomach. The appendix is dilated with diffuse appendiceal wall thickening and hyperenhancement and prominent periappendiceal fat stranding, compatible with acute appendicitis. The tip of the appendix is completely indistinct with a 7.5 x 4.9 x 4.3 cm poorly marginated gas containing abscess surrounding the tip of the appendix in the right lower quadrant (series 2/image 52). There are two 6 mm calcified appendicoliths  located within the periappendiceal abscess. There is reactive wall thickening in the distal ileum and cecum. No small bowel dilatation. No additional sites of small bowel wall thickening. Mild left colonic diverticulosis. Vascular/Lymphatic: Atherosclerotic nonaneurysmal abdominal aorta. Patent portal, splenic, hepatic and renal veins. No pathologically enlarged lymph nodes in the abdomen or pelvis. Reproductive: Mildly enlarged prostate. Other: No ascites. Musculoskeletal: No aggressive appearing  focal osseous lesions. Moderate lumbar spondylosis. Partially visualized fixation hardware in the proximal left femur. IMPRESSION: 1. Perforated acute appendicitis with poorly marginated 7.5 x 4.9 x 4.3 cm gas-containing periappendiceal abscess in the right lower quadrant. Calcified extraluminal subcentimeter appendicoliths are present within the periappendiceal abscess. 2. Reactive wall thickening in the distal ileum and cecum. 3. Mildly enlarged prostate. 4.  Aortic Atherosclerosis (ICD10-I70.0). Electronically Signed   By: Delbert Phenix M.D.   On: 09/08/2019 20:11   Ct Image Guided Drainage By Percutaneous Catheter  Result Date: 09/09/2019 INDICATION: 58 year old male with a history of ruptured appendicitis and abscess EXAM: CT-GUIDED DRAINAGE RIGHT LOWER QUADRANT ABSCESS MEDICATIONS: The patient is currently admitted to the hospital and receiving intravenous antibiotics. The antibiotics were administered within an appropriate time frame prior to the initiation of the procedure. ANESTHESIA/SEDATION: Fentanyl 100 mcg IV; Versed 2.0 mg IV Moderate Sedation Time:  14 MINUTES The patient was continuously monitored during the procedure by the interventional radiology nurse under my direct supervision. COMPLICATIONS: NONE PROCEDURE: Informed written consent was obtained from the patient after a thorough discussion of the procedural risks, benefits and alternatives. All questions were addressed. Maximal Sterile Barrier Technique was utilized including caps, mask, sterile gowns, sterile gloves, sterile drape, hand hygiene and skin antiseptic. A timeout was performed prior to the initiation of the procedure. PATIENT POSITIONED SUPINE POSITION ON THE CT GANTRY TABLE AND A SCOUT CT WAS ACQUIRED. PATIENT IS PREPPED AND DRAPED IN THE USUAL STERILE FASHION. 1% LIDOCAINE WAS USED FOR LOCAL ANESTHESIA. USING CT GUIDANCE, 10 FRENCH PIGTAIL DRAINAGE CATHETER WAS PLACED INTO THE ABSCESS OF THE RIGHT LOWER QUADRANT ASPIRATION  CONFIRMED LOCATION AND THE DRAIN WAS ATTACHED TO BULB SUCTION. SAMPLE SENT FOR CULTURE. PATIENT TOLERATED THE PROCEDURE WELL AND REMAINED HEMODYNAMICALLY STABLE THROUGHOUT. No complications were encountered and no significant blood loss IMPRESSION: Status post CT-guided drainage of right lower quadrant abscess. Signed, Yvone Neu. Reyne Dumas, RPVI [facility] Radiology Electronically Signed   By: Gilmer Mor D.O.   On: 09/09/2019 14:12    Anti-infectives: Anti-infectives (From admission, onward)   Start     Dose/Rate Route Frequency Ordered Stop   09/09/19 0200  piperacillin-tazobactam (ZOSYN) IVPB 3.375 g     3.375 g 12.5 mL/hr over 240 Minutes Intravenous Every 8 hours 09/08/19 2116     09/08/19 2030  piperacillin-tazobactam (ZOSYN) IVPB 3.375 g     3.375 g 100 mL/hr over 30 Minutes Intravenous  Once 09/08/19 2020 09/08/19 2109       Assessment/Plan Elevated BP - Monitor. PRN meds. Currently 136/89  Perforated acute appendicitis with abscess - S/p IR drainage - 10/4 - Continue IV abx - Await Cx - Advance diet - Ambulate and IS - If patient has not had a colonoscopy before, he will need one in 4-6 weeks after resolution  FEN - FLD, AAT VTE - SCDs, Lovenox, ambulate ID - Zosyn 10/3 >   LOS: 2 days    Jacinto Halim , [facility] Surgery 09/10/2019, 10:09 AM Pager: [phone]

## 2019-09-10 NOTE — Progress Notes (Signed)
Patient ID: Jeremy Ewing, male   DOB: 1961-12-03, 58 y.o.   MRN: [id number]    Referring Physician(s): Kinsinger, L   Supervising Physician: Sandi Mariscal  Patient Status:  [facility] - In-pt  Chief Complaint:  Abdominal pain with periappendiceal abscess drain placement  Subjective: Patient reports fever last night which has since subsided. He currently complains of intermittent sharp abdominal pain that he relates to gas. He has not had a bowel movement since the drain placement, but also reports decreased PO. He also reports some incisional tenderness.  Allergies: Patient has no known allergies.  Medications: Prior to Admission medications   Medication Sig Start Date End Date Taking? Authorizing Provider  ibuprofen (ADVIL,MOTRIN) 600 MG tablet Take 1 tablet (600 mg total) by mouth every 6 (six) hours as needed. 07/07/16  Yes Ward, Ozella Almond, PA-C  methocarbamol (ROBAXIN) 500 MG tablet Take 1 tablet (500 mg total) by mouth 2 (two) times daily as needed for muscle spasms. Patient not taking: Reported on 09/08/2019 07/07/16   Ward, Ozella Almond, PA-C     Vital Signs: BP (!) 148/93 (BP Location: Right Arm)    Pulse (!) 103    Temp 100.3 F (37.9 C) (Oral)    Resp 16    Ht 6' (1.829 m)    Wt 218 lb 8 oz (99.1 kg)    SpO2 98%    BMI 29.63 kg/m   Physical Exam  Alert and oriented.  Abdomen: Mild tenderness over incision site, but without signs of infection. 25cc serosanguinous fluid in drain. 5cc NS flush with ease.   Imaging: Ct Abdomen Pelvis W Contrast  Result Date: 09/08/2019 CLINICAL DATA:  Right abdominal pain for several days. EXAM: CT ABDOMEN AND PELVIS WITH CONTRAST TECHNIQUE: Multidetector CT imaging of the abdomen and pelvis was performed using the standard protocol following bolus administration of intravenous contrast. CONTRAST:  189mL OMNIPAQUE IOHEXOL 300 MG/ML  SOLN COMPARISON:  None. FINDINGS: Lower chest: No significant pulmonary nodules or acute consolidative airspace  disease. Hepatobiliary: Normal liver size. No liver mass. Normal gallbladder with no radiopaque cholelithiasis. No biliary ductal dilatation. Pancreas: Normal, with no mass or duct dilation. Spleen: Normal size. No mass. Adrenals/Urinary Tract: Normal adrenals. Normal kidneys with no hydronephrosis and no renal mass. Normal bladder. Stomach/Bowel: Normal non-distended stomach. The appendix is dilated with diffuse appendiceal wall thickening and hyperenhancement and prominent periappendiceal fat stranding, compatible with acute appendicitis. The tip of the appendix is completely indistinct with a 7.5 x 4.9 x 4.3 cm poorly marginated gas containing abscess surrounding the tip of the appendix in the right lower quadrant (series 2/image 52). There are two 6 mm calcified appendicoliths located within the periappendiceal abscess. There is reactive wall thickening in the distal ileum and cecum. No small bowel dilatation. No additional sites of small bowel wall thickening. Mild left colonic diverticulosis. Vascular/Lymphatic: Atherosclerotic nonaneurysmal abdominal aorta. Patent portal, splenic, hepatic and renal veins. No pathologically enlarged lymph nodes in the abdomen or pelvis. Reproductive: Mildly enlarged prostate. Other: No ascites. Musculoskeletal: No aggressive appearing focal osseous lesions. Moderate lumbar spondylosis. Partially visualized fixation hardware in the proximal left femur. IMPRESSION: 1. Perforated acute appendicitis with poorly marginated 7.5 x 4.9 x 4.3 cm gas-containing periappendiceal abscess in the right lower quadrant. Calcified extraluminal subcentimeter appendicoliths are present within the periappendiceal abscess. 2. Reactive wall thickening in the distal ileum and cecum. 3. Mildly enlarged prostate. 4.  Aortic Atherosclerosis (ICD10-I70.0). Electronically Signed   By: Ilona Sorrel M.D.   On: 09/08/2019 20:11  Ct Image Guided Drainage By Percutaneous Catheter  Result Date:  09/09/2019 INDICATION: 58 year old male with a history of ruptured appendicitis and abscess EXAM: CT-GUIDED DRAINAGE RIGHT LOWER QUADRANT ABSCESS MEDICATIONS: The patient is currently admitted to the hospital and receiving intravenous antibiotics. The antibiotics were administered within an appropriate time frame prior to the initiation of the procedure. ANESTHESIA/SEDATION: Fentanyl 100 mcg IV; Versed 2.0 mg IV Moderate Sedation Time:  14 MINUTES The patient was continuously monitored during the procedure by the interventional radiology nurse under my direct supervision. COMPLICATIONS: NONE PROCEDURE: Informed written consent was obtained from the patient after a thorough discussion of the procedural risks, benefits and alternatives. All questions were addressed. Maximal Sterile Barrier Technique was utilized including caps, mask, sterile gowns, sterile gloves, sterile drape, hand hygiene and skin antiseptic. A timeout was performed prior to the initiation of the procedure. PATIENT POSITIONED SUPINE POSITION ON THE CT GANTRY TABLE AND A SCOUT CT WAS ACQUIRED. PATIENT IS PREPPED AND DRAPED IN THE USUAL STERILE FASHION. 1% LIDOCAINE WAS USED FOR LOCAL ANESTHESIA. USING CT GUIDANCE, 10 FRENCH PIGTAIL DRAINAGE CATHETER WAS PLACED INTO THE ABSCESS OF THE RIGHT LOWER QUADRANT ASPIRATION CONFIRMED LOCATION AND THE DRAIN WAS ATTACHED TO BULB SUCTION. SAMPLE SENT FOR CULTURE. PATIENT TOLERATED THE PROCEDURE WELL AND REMAINED HEMODYNAMICALLY STABLE THROUGHOUT. No complications were encountered and no significant blood loss IMPRESSION: Status post CT-guided drainage of right lower quadrant abscess. Signed, Yvone Neu. Reyne Dumas, RPVI [facility] Radiology Electronically Signed   By: Gilmer Mor D.O.   On: 09/09/2019 14:12    Labs:  CBC: Recent Labs    09/08/19 1315 09/09/19 0351 09/10/19 0503  WBC 16.0* 18.1* 20.5*  HGB 14.9 13.1 12.8*  HCT 46.9 41.0 40.7  PLT  422* 373 348    COAGS: Recent Labs    09/09/19 0918  INR 1.1    BMP: Recent Labs    09/08/19 1315 09/09/19 0351 09/10/19 0503  NA 136 138 139  K 3.5 3.3* 3.3*  CL 97* 103 104  CO2 25 25 25   GLUCOSE 139* 131* 171*  BUN 11 9 10   CALCIUM 9.5 8.8* 8.4*  CREATININE 1.33* 1.38* 1.45*  GFRNONAA 58* 56* 52*  GFRAA >60 >60 >60    LIVER FUNCTION TESTS: Recent Labs    09/08/19 1315 09/09/19 0351 09/10/19 0503  BILITOT 1.1 1.0 0.7  AST 22 17 19   ALT 27 23 23   ALKPHOS 90 75 70  PROT 8.4* 6.9 6.7  ALBUMIN 4.2 3.3* 2.9*    Assessment and Plan: RLQ/periappendiceal abscess drain placed 10/4 for perforated appendicitis.  WBC trending upward - 20.5, currently afebrile, but febrile overnight.  Continue IV fluids and antibiotics-- on Zosyn.  Cultures pending but growing gram positive rods, gram positive cocci, gram negative rods.  Continue NS flush qshift.  IR following.  Electronically Signed: 11/09/19, PA 09/10/2019, 2:50 PM   I spent a total of 15 Minutes at the the patient's bedside AND on the patient's hospital floor or unit, greater than 50% of which was counseling/coordinating care for periappendiceal abscess drain

## 2019-09-10 NOTE — Plan of Care (Signed)

## 2019-09-11 LAB — COMPREHENSIVE METABOLIC PANEL
ALT: 43 U/L (ref 0–44)
AST: 41 U/L (ref 15–41)
Albumin: 3.1 g/dL — ABNORMAL LOW (ref 3.5–5.0)
Alkaline Phosphatase: 84 U/L (ref 38–126)
Anion gap: 13 (ref 5–15)
BUN: 13 mg/dL (ref 6–20)
CO2: 21 mmol/L — ABNORMAL LOW (ref 22–32)
Calcium: 8.9 mg/dL (ref 8.9–10.3)
Chloride: 103 mmol/L (ref 98–111)
Creatinine, Ser: 1.37 mg/dL — ABNORMAL HIGH (ref 0.61–1.24)
GFR calc Af Amer: >60 mL/min (ref >60)
GFR calc non Af Amer: 56 mL/min — ABNORMAL LOW (ref >60)
Glucose, Bld: 125 mg/dL — ABNORMAL HIGH (ref 70–99)
Potassium: 4 mmol/L (ref 3.5–5.1)
Sodium: 137 mmol/L (ref 135–145)
Total Bilirubin: 0.7 mg/dL (ref 0.3–1.2)
Total Protein: 7.6 g/dL (ref 6.5–8.1)

## 2019-09-11 LAB — CBC
HCT: 41.8 % (ref 39.0–52.0)
Hemoglobin: 13.1 g/dL (ref 13.0–17.0)
MCH: 30.1 pg (ref 26.0–34.0)
MCHC: 31.3 g/dL (ref 30.0–36.0)
MCV: 96.1 fL (ref 80.0–100.0)
Platelets: 439 10*3/uL — ABNORMAL HIGH (ref 150–400)
RBC: 4.35 MIL/uL (ref 4.22–5.81)
RDW: 13.1 % (ref 11.5–15.5)
WBC: 14.5 10*3/uL — ABNORMAL HIGH (ref 4.0–10.5)
nRBC: 0 % (ref 0.0–0.2)

## 2019-09-11 MED ORDER — OXYCODONE HCL 5 MG PO TABS: 5.0000 mg | ORAL_TABLET | Freq: Four times a day (QID) | ORAL | 0 refills | Status: DC | PRN

## 2019-09-11 MED ORDER — METRONIDAZOLE 500 MG PO TABS: 500.0000 mg | ORAL_TABLET | Freq: Three times a day (TID) | ORAL | 0 refills | Status: AC

## 2019-09-11 MED ORDER — CIPROFLOXACIN HCL 500 MG PO TABS: 500.0000 mg | ORAL_TABLET | Freq: Two times a day (BID) | ORAL | 0 refills | Status: DC

## 2019-09-11 MED ORDER — AMOXICILLIN-POT CLAVULANATE 875-125 MG PO TABS
1.0000 | ORAL_TABLET | Freq: Two times a day (BID) | ORAL | Status: DC
  Administered 2019-09-11: 1 via ORAL
  Filled 2019-09-11: qty 1

## 2019-09-11 MED ORDER — AMOXICILLIN-POT CLAVULANATE 875-125 MG PO TABS: 1.0000 | ORAL_TABLET | Freq: Two times a day (BID) | ORAL | 0 refills | Status: DC

## 2019-09-11 NOTE — Discharge Instructions (Signed)
Percutaneous Abscess Drain, Care After This sheet gives you information about how to care for yourself after your procedure. Your health care provider may also give you more specific instructions. If you have problems or questions, contact your health care provider. What can I expect after the procedure? After your procedure, it is common to have:  A small amount of bruising and discomfort in the area where the drainage tube (catheter) was placed.  Sleepiness and fatigue. This should go away after the medicines you were given have worn off. Follow these instructions at home: Incision care  Follow instructions from your health care provider about how to take care of your incision. Make sure you: ? Wash your hands with soap and water before you change your bandage (dressing). If soap and water are not available, use hand sanitizer. ? Change your dressing as told by your health care provider. ? Leave stitches (sutures), skin glue, or adhesive strips in place. These skin closures may need to stay in place for 2 weeks or longer. If adhesive strip edges start to loosen and curl up, you may trim the loose edges. Do not remove adhesive strips completely unless your health care provider tells you to do that.  Check your incision area every day for signs of infection. Check for: ? More redness, swelling, or pain. ? More fluid or blood. ? Warmth. ? Pus or a bad smell. ? Fluid leaking from around your catheter (instead of fluid draining through your catheter). Catheter care   Follow instructions from your health care provider about emptying and cleaning your catheter and collection bag. You may need to clean the catheter every day so it does not clog.  If directed, write down the following information every time you empty your bag: ? The date and time. ? The amount of drainage. General instructions  Rest at home for 1-2 days after your procedure. Return to your normal activities as told by your  health care provider.  Do not take baths, swim, or use a hot tub for 24 hours after your procedure, or until your health care provider says that this is okay.  Take over-the-counter and prescription medicines only as told by your health care provider.  Keep all follow-up visits as told by your health care provider. This is important. Contact a health care provider if:  You have less than 10 mL of drainage a day for 2-3 days in a row, or as directed by your health care provider.  You have more redness, swelling, or pain around your incision area.  You have more fluid or blood coming from your incision area.  Your incision area feels warm to the touch.  You have pus or a bad smell coming from your incision area.  You have fluid leaking from around your catheter (instead of through your catheter).  You have a fever or chills.  You have pain that does not get better with medicine. Get help right away if:  Your catheter comes out.  You suddenly stop having drainage from your catheter.  You suddenly have blood in the fluid that is draining from your catheter.  You become dizzy or you faint.  You develop a rash.  You have nausea or vomiting.  You have difficulty breathing or you feel short of breath.  You develop chest pain.  You have problems with your speech or vision.  You have trouble balancing or moving your arms or legs. Summary  It is common to have a small  amount of bruising and discomfort in the area where the drainage tube (catheter) was placed.  You may be directed to record the amount of drainage from the bag every time you empty it.  Follow instructions from your health care provider about emptying and cleaning your catheter and collection bag. This information is not intended to replace advice given to you by your health care provider. Make sure you discuss any questions you have with your health care provider. Document Released: 04/08/2014 Document  Revised: 11/04/2017 Document Reviewed: 10/14/2016 Elsevier Patient Education  [employer].   Appendicitis, Adult  Appendicitis is inflammation of the appendix. The appendix is a finger-shaped tube that is attached to the large intestine. If appendicitis is not treated, it can cause the appendix to tear (rupture). A ruptured appendix can lead to a life-threatening infection. It can also cause a painful collection of pus (abscess) to form in the appendix. What are the causes? This condition may be caused by a blockage in the appendix that leads to infection. The blockage can be caused by:  A ball of stool (feces).  Enlarged lymph glands. In some cases, the cause may not be known. What increases the risk? Age is a risk factor. You are more likely to develop this condition if you are between 14 and 39 years of age. What are the signs or symptoms? Symptoms of this condition include:  Pain that starts around the belly button and moves toward the lower right part of the abdomen. The pain can become more severe as time passes. It gets worse with coughing or sudden movements.  Tenderness in the lower right abdomen.  Nausea.  Vomiting.  Loss of appetite.  Fever.  Difficulty passing stool (constipation).  Passing very loose stools (diarrhea).  Generally feeling unwell. How is this diagnosed? This condition may be diagnosed with:  A physical exam.  Blood tests.  Urine test. To confirm the diagnosis, an ultrasound, MRI, or CT scan may be done. How is this treated? This condition is usually treated with surgery to remove the appendix (appendectomy). There are two methods for doing an appendectomy:  Open appendectomy. In this surgery, the appendix is removed through a large incision that is made in the lower right abdomen. This procedure may be recommended if: ? You have major scarring from a previous surgery. ? You have a bleeding disorder. ? You are pregnant and are about  to give birth. ? You have a condition that makes it hard to do surgery through small incisions (laparoscopic procedure). This includes severe infection or a ruptured appendix.  Laparoscopic appendectomy. In this surgery, the appendix is removed through small incisions. This procedure usually causes less pain and fewer problems than an open appendectomy. It also has a shorter recovery time. If the appendix has ruptured and an abscess has formed:  A drain may be placed into the abscess to remove fluid.  Antibiotic medicines may be given through an IV.  The appendix may or may not need to be removed. Follow these instructions at home: If you had surgery, follow instructions from your health care provider about how to care for yourself at home and how to care for your incision. Medicines  Take over-the-counter and prescription medicines only as told by your health care provider.  If you were prescribed an antibiotic medicine, take it as told by your health care provider. Do not stop taking the antibiotic even if you start to feel better. Eating and drinking  Follow instructions  from your health care provider about eating restrictions. You may slowly resume a regular diet once your nausea or vomiting stops. General instructions  Do not use any products that contain nicotine or tobacco, such as cigarettes, e-cigarettes, and chewing tobacco. If you need help quitting, ask your health care provider.  Do not drive or use heavy machinery while taking prescription pain medicine.  Ask your health care provider if the medicine prescribed to you can cause constipation. You may need to take steps to prevent or treat constipation, such as: ? Drink enough fluid to keep your urine pale yellow. ? Take over-the-counter or prescription medicines. ? Eat foods that are high in fiber, such as beans, whole grains, and fresh fruits and vegetables. ? Limit foods that are high in fat and processed sugars, such as  fried or sweet foods.  Keep all follow-up visits as told by your health care provider. This is important. Contact a health care provider if:  There is pus, blood, or excessive drainage coming from your incision.  You have nausea or vomiting. Get help right away if you have:  Worsening abdominal pain.  A fever.  Chills.  Fatigue.  Muscle aches.  Shortness of breath. Summary  Appendicitis is inflammation of the appendix.  This condition may be caused by a blockage in the appendix that leads to infection.  This condition is usually treated with surgery to remove the appendix. This information is not intended to replace advice given to you by your health care provider. Make sure you discuss any questions you have with your health care provider. Document Released: 11/22/2005 Document Revised: 05/10/2018 Document Reviewed: 05/10/2018 Elsevier Patient Education  2020 [employer].

## 2019-09-11 NOTE — Progress Notes (Signed)
Maintenance of JP drain demonstrated to patient and patient able to provide teachback. All questions and concerns addressed by this nurse to patient satisfaction.

## 2019-09-11 NOTE — Discharge Summary (Signed)
    Patient ID: Jeremy Ewing [id number] 1961-05-16 58 y.o.  Admit date: 09/08/2019 Discharge date: 09/11/2019  Admitting Diagnosis: Perforated appendicitis with abscess   Discharge Diagnosis Patient Active Problem List   Diagnosis Date Noted  . Perforated appendicitis 09/08/2019    Consultants Interventional radiology  Procedures 1. Dr. Corrie Mckusick   - CT guided drain placement of appendiceal abscess  Hospital Course:  Jeremy Ewing is a 58 y.o. male who presented with a 4-day history of right lower quadrant abdominal pain, chills, nausea, and decreased appetite and was found to have perforated acute appendicitis with periappendiceal abscess on CT imaging.  Patient was admitted to the general surgery service and started on IV antibiotics.  Interventional radiology was consulted and placed a CT-guided drain on 10/4.  Patient tolerated the procedure well.  Patients diet was advanced and tolerated. On 10/6, the patient was voiding well, tolerating diet, ambulating well, pain well controlled, vital signs stable, drain in place and felt stable for discharge home. He was discharged home on abx for a total of 14 days. Follow up as noted below.   Physical Exam: Please see progress note from earlier today.   Allergies as of 09/11/2019   No Known Allergies     Medication List    STOP taking these medications   ibuprofen 600 MG tablet Commonly known as: ADVIL   methocarbamol 500 MG tablet Commonly known as: ROBAXIN     TAKE these medications   ciprofloxacin 500 MG tablet Commonly known as: CIPRO Take 1 tablet (500 mg total) by mouth 2 (two) times daily.   metroNIDAZOLE 500 MG tablet Commonly known as: FLAGYL Take 1 tablet (500 mg total) by mouth 3 (three) times daily for 12 days.   oxyCODONE 5 MG immediate release tablet Commonly known as: Oxy IR/ROXICODONE Take 1 tablet (5 mg total) by mouth every 6 (six) hours as needed.        Follow-up Information    Kinsinger,  Arta Bruce, MD. Call.   Specialty: General Surgery Why: To make an appointment in 3 weeks. Please arrive 30 minutes early for follow up. Please bring a copy of your photo ID and insurance card to the appointment.  Contact information: [place] [address] [phone]        [place]. Call.   Why: Please call to schedule a follow up appointment for your drain Contact information: [place] [address] [phone]           Signed: Alferd Apa, [facility] Surgery 09/13/2019, 4:06 PM Pager: [phone]

## 2019-09-11 NOTE — Progress Notes (Signed)
Subjective: CC: Patient doing well. He is tolerating his diet without any n/v. He reports that his abdominal pain has improved and is now only mild. He had a BM this AM. T max 100.3.  Objective: Vital signs in last 24 hours: Temp:  [98.9 F (37.2 C)-100.3 F (37.9 C)] 98.9 F (37.2 C) (10/05 2212) Pulse Rate:  [99-103] 99 (10/05 2212) Resp:  [16] 16 (10/05 1259) BP: (148-151)/(93-97) 151/97 (10/05 2212) SpO2:  [95 %-98 %] 97 % (10/05 2212) Last BM Date: 09/07/19  Intake/Output from previous day: 10/05 0701 - 10/06 0700 In: 436.8 [I.V.:276.7; IV Piggyback:150.1] Out: 765 [Urine:700; Drains:65] Intake/Output this shift: No intake/output data recorded.  PE: Gen:  Alert, NAD, pleasant Card:  RRR Pulm:  CTA b/l, normal rate and effort  Abd: Soft, protuberant, less tenderness of the RLQ without r/r/g. No peritonitis. +BS. RLQ with 65cc/24 hours purulent like fluid with some debris.  Ext:  No LE edema Psych: A&Ox3  Skin: no rashes noted, warm and dry  Lab Results:  Recent Labs    09/10/19 0503 09/11/19 0828  WBC 20.5* 14.5*  HGB 12.8* 13.1  HCT 40.7 41.8  PLT 348 439*   BMET Recent Labs    09/09/19 0351 09/10/19 0503  NA 138 139  K 3.3* 3.3*  CL 103 104  CO2 25 25  GLUCOSE 131* 171*  BUN 9 10  CREATININE 1.38* 1.45*  CALCIUM 8.8* 8.4*   PT/INR Recent Labs    09/09/19 0918  LABPROT 14.4  INR 1.1   CMP     Component Value Date/Time   NA 139 09/10/2019 0503   K 3.3 (L) 09/10/2019 0503   CL 104 09/10/2019 0503   CO2 25 09/10/2019 0503   GLUCOSE 171 (H) 09/10/2019 0503   BUN 10 09/10/2019 0503   CREATININE 1.45 (H) 09/10/2019 0503   CALCIUM 8.4 (L) 09/10/2019 0503   PROT 6.7 09/10/2019 0503   ALBUMIN 2.9 (L) 09/10/2019 0503   AST 19 09/10/2019 0503   ALT 23 09/10/2019 0503   ALKPHOS 70 09/10/2019 0503   BILITOT 0.7 09/10/2019 0503   GFRNONAA 52 (L) 09/10/2019 0503   GFRAA >60 09/10/2019 0503   Lipase     Component Value Date/Time   LIPASE 28 09/08/2019 1315       Studies/Results: Ct Image Guided Drainage By Percutaneous Catheter  Result Date: 09/09/2019 INDICATION: 58 year old male with a history of ruptured appendicitis and abscess EXAM: CT-GUIDED DRAINAGE RIGHT LOWER QUADRANT ABSCESS MEDICATIONS: The patient is currently admitted to the hospital and receiving intravenous antibiotics. The antibiotics were administered within an appropriate time frame prior to the initiation of the procedure. ANESTHESIA/SEDATION: Fentanyl 100 mcg IV; Versed 2.0 mg IV Moderate Sedation Time:  14 MINUTES The patient was continuously monitored during the procedure by the interventional radiology nurse under my direct supervision. COMPLICATIONS: NONE PROCEDURE: Informed written consent was obtained from the patient after a thorough discussion of the procedural risks, benefits and alternatives. All questions were addressed. Maximal Sterile Barrier Technique was utilized including caps, mask, sterile gowns, sterile gloves, sterile drape, hand hygiene and skin antiseptic. A timeout was performed prior to the initiation of the procedure. PATIENT POSITIONED SUPINE POSITION ON THE CT GANTRY TABLE AND A SCOUT CT WAS ACQUIRED. PATIENT IS PREPPED AND DRAPED IN THE USUAL STERILE FASHION. 1% LIDOCAINE WAS USED FOR LOCAL ANESTHESIA. USING CT GUIDANCE, 10 FRENCH PIGTAIL DRAINAGE CATHETER WAS PLACED INTO THE ABSCESS OF THE RIGHT LOWER QUADRANT ASPIRATION  CONFIRMED LOCATION AND THE DRAIN WAS ATTACHED TO BULB SUCTION. SAMPLE SENT FOR CULTURE. PATIENT TOLERATED THE PROCEDURE WELL AND REMAINED HEMODYNAMICALLY STABLE THROUGHOUT. No complications were encountered and no significant blood loss IMPRESSION: Status post CT-guided drainage of right lower quadrant abscess. Signed, Dulcy Fanny. Dellia Nims, RPVI Vascular and Interventional Radiology Specialists [facility] Radiology Electronically Signed   By: Corrie Mckusick D.O.   On: 09/09/2019 14:12    Anti-infectives:  Anti-infectives (From admission, onward)   Start     Dose/Rate Route Frequency Ordered Stop   09/11/19 1000  amoxicillin-clavulanate (AUGMENTIN) 875-125 MG per tablet 1 tablet     1 tablet Oral Every 12 hours 09/11/19 0839 09/23/19 0959   09/09/19 0200  piperacillin-tazobactam (ZOSYN) IVPB 3.375 g  Status:  Discontinued     3.375 g 12.5 mL/hr over 240 Minutes Intravenous Every 8 hours 09/08/19 2116 09/11/19 0839   09/08/19 2030  piperacillin-tazobactam (ZOSYN) IVPB 3.375 g     3.375 g 100 mL/hr over 30 Minutes Intravenous  Once 09/08/19 2020 09/08/19 2109       Assessment/Plan Elevated BP - Monitor. PRN meds.  Perforated acute appendicitis with abscess - S/p IR drainage - 10/4 - WBC trending down.  - Transition to oral abx - Await Cx, currently with mod gram - rods   - Advance diet - Ambulate and IS - If patient has not had a colonoscopy before, he will need one in 4-6 weeks after resolution  FEN - Regular VTE - SCDs, Lovenox, ambulate ID - Zosyn 10/3 >  Follow up - IR and Dr. Kieth Brightly   Plan: Recheck later today for likely discharge. Drain teaching.    LOS: 3 days    Jillyn Ledger , [facility] Surgery 09/11/2019, 8:41 AM Pager: [phone]

## 2019-09-14 LAB — AEROBIC/ANAEROBIC CULTURE W GRAM STAIN (SURGICAL/DEEP WOUND)

## 2019-09-17 ENCOUNTER — Other Ambulatory Visit: Payer: Self-pay | Admitting: General Surgery

## 2019-09-17 DIAGNOSIS — K651 Peritoneal abscess: Secondary | ICD-10-CM

## 2019-09-20 ENCOUNTER — Ambulatory Visit
Admission: RE | Admit: 2019-09-20 | Discharge: 2019-09-20 | Disposition: A | Discharge status: Home / Self Care | Payer: BLUE CROSS/BLUE SHIELD | Source: Ambulatory Visit | Attending: Radiology | Admitting: Radiology

## 2019-09-20 ENCOUNTER — Encounter: Payer: Self-pay | Admitting: Radiology

## 2019-09-20 ENCOUNTER — Ambulatory Visit
Admission: RE | Admit: 2019-09-20 | Discharge: 2019-09-20 | Disposition: A | Discharge status: Home / Self Care | Payer: BLUE CROSS/BLUE SHIELD | Source: Ambulatory Visit | Attending: General Surgery | Admitting: General Surgery

## 2019-09-20 ENCOUNTER — Other Ambulatory Visit: Payer: Self-pay | Admitting: General Surgery

## 2019-09-20 DIAGNOSIS — K651 Peritoneal abscess: Secondary | ICD-10-CM

## 2019-09-20 HISTORY — PX: IR RADIOLOGIST EVAL & MGMT: IMG5224

## 2019-09-20 MED ORDER — IOPAMIDOL (ISOVUE-300) INJECTION 61%
125.0000 mL | Freq: Once | INTRAVENOUS | Status: AC | PRN
  Administered 2019-09-20: 125 mL via INTRAVENOUS

## 2019-09-20 NOTE — Progress Notes (Addendum)
Chief Complaint: Patient was seen in consultation today for follow-up of the periappendiceal abscess.  Referring Physician(s): Kinsinger, Lurena Joiner  History of Present Illness: Jeremy Ewing is a 58 y.o. male with periappendiceal abscess and recently discharged from the hospital.  Periappendiceal abscess has been treated with antibiotics and percutaneous drain.  Patient reports approximately 10 mL of cloudy fluid from the drain per day.  He is not flushing the drain.  He denies fevers or chills.  The right abdominal pain has resolved since he was admitted to the hospital and treated for the abscess.  Patient has no specific complaints at this time.  No past medical history on file.  Past Surgical History:  Procedure Laterality Date   IR RADIOLOGIST EVAL & MGMT  09/20/2019   LEG SURGERY      Allergies: Patient has no known allergies.  Medications: Prior to Admission medications   Medication Sig Start Date End Date Taking? Authorizing Provider  ciprofloxacin (CIPRO) 500 MG tablet Take 1 tablet (500 mg total) by mouth 2 (two) times daily. 09/11/19   Maczis, Barth Kirks, PA-C  metroNIDAZOLE (FLAGYL) 500 MG tablet Take 1 tablet (500 mg total) by mouth 3 (three) times daily for 12 days. 09/11/19 09/23/19  Maczis, Barth Kirks, PA-C  oxyCODONE (OXY IR/ROXICODONE) 5 MG immediate release tablet Take 1 tablet (5 mg total) by mouth every 6 (six) hours as needed. 09/11/19   Maczis, Barth Kirks, PA-C     No family history on file.  Social History   Socioeconomic History   Marital status: Married    Spouse name: Not on file   Number of children: Not on file   Years of education: Not on file   Highest education level: Not on file  Occupational History   Not on file  Social Needs   Financial resource strain: Not on file   Food insecurity    Worry: Not on file    Inability: Not on file   Transportation needs    Medical: Not on file    Non-medical: Not on file  Tobacco Use    Smoking status: Never Smoker   Smokeless tobacco: Never Used  Substance and Sexual Activity   Alcohol use: Yes   Drug use: Never   Sexual activity: Not on file  Lifestyle   Physical activity    Days per week: Not on file    Minutes per session: Not on file   Stress: Not on file  Relationships   Social connections    Talks on phone: Not on file    Gets together: Not on file    Attends religious service: Not on file    Active member of club or organization: Not on file    Attends meetings of clubs or organizations: Not on file    Relationship status: Not on file  Other Topics Concern   Not on file  Social History Narrative   Not on file     Review of Systems  Constitutional: Negative for chills and fever.  Gastrointestinal: Negative for abdominal distention and abdominal pain.    Vital Signs: BP (!) 145/94    Pulse 83    Temp 98.3 F (36.8 C)    SpO2 98%   Physical Exam Constitutional:      Appearance: He is not ill-appearing.  Abdominal:     General: There is no distension.     Palpations: Abdomen is soft.     Tenderness: There is no abdominal tenderness.  Comments: Percutaneous drain in the right lower quadrant of the abscess.  Retention suture is intact.  No drainage or erythema around the drain.  Neurological:     Mental Status: He is alert.        Imaging: Ct Abdomen Pelvis W Contrast  Result Date: 09/20/2019 CLINICAL DATA:  58 year old with history of perforated appendicitis. Patient has been treated with combination of antibiotics and percutaneous drainage. Patient is feeling much better and reports minimal output from the drain. CT-guided drain placement was performed on 09/09/2019. EXAM: CT ABDOMEN AND PELVIS WITH CONTRAST TECHNIQUE: Multidetector CT imaging of the abdomen and pelvis was performed using the standard protocol following bolus administration of intravenous contrast. CONTRAST:  ISOVUE-300 IOPAMIDOL (ISOVUE-300) INJECTION 61%  COMPARISON:  CT of the abdomen pelvis 09/08/2019 FINDINGS: Lower chest: Tiny nodule at the left lung base on sequence 4, image 14 appeared calcified on the prior examination. Stable 4 mm nodule in the lingula on image 6. No pleural effusions. Hepatobiliary: Normal appearance of the liver and gallbladder. Portal venous system is patent. No biliary dilatation. Pancreas: Unremarkable. No pancreatic ductal dilatation or surrounding inflammatory changes. Spleen: There is a punctate hypodensity along the medial aspect of the spleen on image 18 and this is likely an incidental finding. Adrenals/Urinary Tract: Normal appearance of the bilateral adrenal glands. Subtle density and possible wall thickening along the left posterior aspect of the urinary bladder near the left ureterovesical junction. This findings is seen on sequence 3, image 79 and confirmed on the sagittal and coronal reformats. No bladder distension. Normal appearance of both kidneys without hydronephrosis. No suspicious renal lesions. Small hypodensity in the posterior left kidney could represent a tiny cyst but too small to definitively characterize. Stomach/Bowel: Normal appearance of the stomach and duodenum. Few colonic diverticula involving the left colon. Persistent inflammatory changes in the right lower quadrant associated with the cecum. Pigtail drain adjacent to the cecum with poorly defined soft tissue structures and a small amount of gas and fluid adjacent to the drain. In particular, there is a air-fluid collection measuring up to 1.8 cm on sequence 2, image 52 that is cephalad to the drain and not clear if this represents residual abscess or related to the cecum or appendix. There is not a large distinct abscess collection remaining in this area. Normal appearance of the terminal ileum. Vascular/Lymphatic: Mild atherosclerotic disease in the abdominal aorta without aneurysm. Main visceral arteries are patent. Venous structures are unremarkable.  Again noted is a mildly prominent lymph node in the right ileocolic mesentery measuring roughly 8 mm. Otherwise, no significant lymph node enlargement in the abdomen or pelvis. Reproductive: Prostate is prominent measuring 5.0 cm in the transverse dimension. Symmetric appearance of the seminal vesicles. Other: Decreased but residual stranding in the right lower quadrant. Periappendiceal abscess has essentially resolved. Residual inflammatory changes in this area and difficult to exclude small residual abscess versus inflamed bowel. Small appendicoliths adjacent to the drain. No new abscess formations. Negative for ascites. Musculoskeletal: Intramedullary nail in the left femur. Degenerative facet disease in lower lumbar spine. IMPRESSION: 1. Periappendiceal abscess has nearly resolved since placement of the percutaneous drain. Residual inflammatory changes around the drain and cecum. Small residual air-fluid collection near the drain could represent small residual abscess versus appendix/bowel. Decreased but persistent inflammatory changes in the right lower quadrant. 2. No new abscess collections. 3. **An incidental finding of potential clinical significance has been found. Asymmetric density and possible wall thickening involving the left posterior urinary bladder.  Urothelial lesion cannot be excluded and recommend urology consultation.** 4. Two small pulmonary nodules at the left lung base. No follow-up needed if patient is low-risk (and has no known or suspected primary neoplasm). Non-contrast chest CT can be considered in 12 months if patient is high-risk. This recommendation follows the consensus statement: Guidelines for Management of Incidental Pulmonary Nodules Detected on CT Images: From the Fleischner Society 2017; Radiology 2017; 284:228-243. Electronically Signed   By: Richarda OverlieAdam  Illyana Schorsch M.D.   On: 09/20/2019 10:27   Ct Abdomen Pelvis W Contrast  Result Date: 09/08/2019 CLINICAL DATA:  Right abdominal pain  for several days. EXAM: CT ABDOMEN AND PELVIS WITH CONTRAST TECHNIQUE: Multidetector CT imaging of the abdomen and pelvis was performed using the standard protocol following bolus administration of intravenous contrast. CONTRAST:  100mL OMNIPAQUE IOHEXOL 300 MG/ML  SOLN COMPARISON:  None. FINDINGS: Lower chest: No significant pulmonary nodules or acute consolidative airspace disease. Hepatobiliary: Normal liver size. No liver mass. Normal gallbladder with no radiopaque cholelithiasis. No biliary ductal dilatation. Pancreas: Normal, with no mass or duct dilation. Spleen: Normal size. No mass. Adrenals/Urinary Tract: Normal adrenals. Normal kidneys with no hydronephrosis and no renal mass. Normal bladder. Stomach/Bowel: Normal non-distended stomach. The appendix is dilated with diffuse appendiceal wall thickening and hyperenhancement and prominent periappendiceal fat stranding, compatible with acute appendicitis. The tip of the appendix is completely indistinct with a 7.5 x 4.9 x 4.3 cm poorly marginated gas containing abscess surrounding the tip of the appendix in the right lower quadrant (series 2/image 52). There are two 6 mm calcified appendicoliths located within the periappendiceal abscess. There is reactive wall thickening in the distal ileum and cecum. No small bowel dilatation. No additional sites of small bowel wall thickening. Mild left colonic diverticulosis. Vascular/Lymphatic: Atherosclerotic nonaneurysmal abdominal aorta. Patent portal, splenic, hepatic and renal veins. No pathologically enlarged lymph nodes in the abdomen or pelvis. Reproductive: Mildly enlarged prostate. Other: No ascites. Musculoskeletal: No aggressive appearing focal osseous lesions. Moderate lumbar spondylosis. Partially visualized fixation hardware in the proximal left femur. IMPRESSION: 1. Perforated acute appendicitis with poorly marginated 7.5 x 4.9 x 4.3 cm gas-containing periappendiceal abscess in the right lower quadrant.  Calcified extraluminal subcentimeter appendicoliths are present within the periappendiceal abscess. 2. Reactive wall thickening in the distal ileum and cecum. 3. Mildly enlarged prostate. 4.  Aortic Atherosclerosis (ICD10-I70.0). Electronically Signed   By: Delbert PhenixJason A Poff M.D.   On: 09/08/2019 20:11   Ct Image Guided Drainage By Percutaneous Catheter  Result Date: 09/09/2019 INDICATION: 58 year old male with a history of ruptured appendicitis and abscess EXAM: CT-GUIDED DRAINAGE RIGHT LOWER QUADRANT ABSCESS MEDICATIONS: The patient is currently admitted to the hospital and receiving intravenous antibiotics. The antibiotics were administered within an appropriate time frame prior to the initiation of the procedure. ANESTHESIA/SEDATION: Fentanyl 100 mcg IV; Versed 2.0 mg IV Moderate Sedation Time:  14 MINUTES The patient was continuously monitored during the procedure by the interventional radiology nurse under my direct supervision. COMPLICATIONS: NONE PROCEDURE: Informed written consent was obtained from the patient after a thorough discussion of the procedural risks, benefits and alternatives. All questions were addressed. Maximal Sterile Barrier Technique was utilized including caps, mask, sterile gowns, sterile gloves, sterile drape, hand hygiene and skin antiseptic. A timeout was performed prior to the initiation of the procedure. PATIENT POSITIONED SUPINE POSITION ON THE CT GANTRY TABLE AND A SCOUT CT WAS ACQUIRED. PATIENT IS PREPPED AND DRAPED IN THE USUAL STERILE FASHION. 1% LIDOCAINE WAS USED FOR LOCAL ANESTHESIA. USING CT GUIDANCE,  10 FRENCH PIGTAIL DRAINAGE CATHETER WAS PLACED INTO THE ABSCESS OF THE RIGHT LOWER QUADRANT ASPIRATION CONFIRMED LOCATION AND THE DRAIN WAS ATTACHED TO BULB SUCTION. SAMPLE SENT FOR CULTURE. PATIENT TOLERATED THE PROCEDURE WELL AND REMAINED HEMODYNAMICALLY STABLE THROUGHOUT. No complications were encountered and no significant blood loss IMPRESSION: Status post CT-guided  drainage of right lower quadrant abscess. Signed, Yvone Neu. Reyne Dumas, RPVI Vascular and Interventional Radiology Specialists [facility] Radiology Electronically Signed   By: Gilmer Mor D.O.   On: 09/09/2019 14:12   Ir Radiologist Eval & Mgmt  Result Date: 09/20/2019 Please refer to notes tab for details about interventional procedure. (Op Note)   Labs:  CBC: Recent Labs    09/08/19 1315 09/09/19 0351 09/10/19 0503 09/11/19 0828  WBC 16.0* 18.1* 20.5* 14.5*  HGB 14.9 13.1 12.8* 13.1  HCT 46.9 41.0 40.7 41.8  PLT 422* 373 348 439*    COAGS: Recent Labs    09/09/19 0918  INR 1.1    BMP: Recent Labs    09/08/19 1315 09/09/19 0351 09/10/19 0503 09/11/19 0828  NA 136 138 139 137  K 3.5 3.3* 3.3* 4.0  CL 97* 103 104 103  CO2 21*  GLUCOSE 139* 131* 171* 125*  BUN CALCIUM 9.5 8.8* 8.4* 8.9  CREATININE 1.33* 1.38* 1.45* 1.37*  GFRNONAA 58* 56* 52* 56*  GFRAA >60 >60 >60 >60    LIVER FUNCTION TESTS: Recent Labs    09/08/19 1315 09/09/19 0351 09/10/19 0503 09/11/19 0828  BILITOT 1.1 1.0 0.7 0.7  AST 41  ALT 43  ALKPHOS 90 75 70 84  PROT 8.4* 6.9 6.7 7.6  ALBUMIN 4.2 3.3* 2.9* 3.1*    TUMOR MARKERS: No results for input(s): AFPTM, CEA, CA199, CHROMGRNA in the last 8760 hours.  Assessment and Plan:  58 year old with periappendiceal abscess compatible with a perforated appendicitis.  Patient is feeling much better since he has been on antibiotics and had the percutaneous drain.  Patient reports minimal output from the drain.  CT of the abdomen today demonstrates that the abscess has markedly improved but there are residual inflammatory changes around the drain.  Drain injection demonstrated small filling collections around the drain that could represent small residual abscess cavities and concern for an underlying fistula to the cecum or appendix.    Suction bulb was changed to a gravity bag.  New StatLock was  placed.  Patient should be following up with surgery in 1 to 2 weeks and await the recommendations with regards to surgical intervention.  Otherwise, we will plan to see the patient back in 2 weeks for follow-up drain injection and CT.  CT examination today raises concern for a subtle abnormality along the left posterior aspect of the urinary bladder.  A urothelial lesion cannot be excluded and recommend urology consultation.   Electronically Signed: Arn Medal 09/20/2019, 10:53 AM   I spent a total of    10 Minutes in face to face in clinical consultation, greater than 50% of which was counseling/coordinating care for drain care.  patient ID: Jeremy Ewing, male   DOB: 01/21/1961, 58 y.o.   MRN: [id number]

## 2019-10-04 ENCOUNTER — Encounter: Payer: Self-pay | Admitting: *Deleted

## 2019-10-04 ENCOUNTER — Ambulatory Visit
Admission: RE | Admit: 2019-10-04 | Discharge: 2019-10-04 | Disposition: A | Discharge status: Home / Self Care | Payer: BLUE CROSS/BLUE SHIELD | Source: Ambulatory Visit | Attending: General Surgery | Admitting: General Surgery

## 2019-10-04 DIAGNOSIS — K651 Peritoneal abscess: Secondary | ICD-10-CM

## 2019-10-04 HISTORY — PX: IR RADIOLOGIST EVAL & MGMT: IMG5224

## 2019-10-04 MED ORDER — IOPAMIDOL (ISOVUE-300) INJECTION 61%
125.0000 mL | Freq: Once | INTRAVENOUS | Status: AC | PRN
  Administered 2019-10-04: 125 mL via INTRAVENOUS

## 2019-10-04 NOTE — Progress Notes (Signed)
Interventional Radiology Progress Note  Jeremy Ewing is 58 yo male presenting today with a drain from ruptured appendicitis.    This was placed 09/09/2019.  Culture was + for e coli and strep group C.    He has a follow up appointment with surgery tomorrow, and CT was performed today.   The abscess seems essentially resolved, with some inflammation in the region of appendix.    Appendicolith is present.    Unfortunately, the drain was essentially pulled out of the skin on his presentation today, with externalized side-holes.  We removed the drain.    I have encouraged him to observe his surgical follow up.    Signed,  Dulcy Fanny. Earleen Newport, DO

## 2020-02-26 ENCOUNTER — Other Ambulatory Visit: Payer: Self-pay | Admitting: Family Medicine

## 2020-02-26 DIAGNOSIS — K859 Acute pancreatitis without necrosis or infection, unspecified: Secondary | ICD-10-CM

## 2020-02-29 ENCOUNTER — Other Ambulatory Visit: Payer: BLUE CROSS/BLUE SHIELD

## 2020-02-29 ENCOUNTER — Ambulatory Visit
Admission: RE | Admit: 2020-02-29 | Discharge: 2020-02-29 | Disposition: A | Discharge status: Home / Self Care | Payer: 59 | Source: Ambulatory Visit | Attending: Family Medicine | Admitting: Family Medicine

## 2020-02-29 DIAGNOSIS — K859 Acute pancreatitis without necrosis or infection, unspecified: Secondary | ICD-10-CM

## 2020-02-29 MED ORDER — IOPAMIDOL (ISOVUE-300) INJECTION 61%
100.0000 mL | Freq: Once | INTRAVENOUS | Status: AC | PRN
  Administered 2020-02-29: 100 mL via INTRAVENOUS

## 2020-03-14 ENCOUNTER — Other Ambulatory Visit: Payer: BLUE CROSS/BLUE SHIELD

## 2020-03-18 ENCOUNTER — Other Ambulatory Visit: Payer: Self-pay | Admitting: Gastroenterology

## 2020-03-18 DIAGNOSIS — R935 Abnormal findings on diagnostic imaging of other abdominal regions, including retroperitoneum: Secondary | ICD-10-CM

## 2020-03-18 DIAGNOSIS — K838 Other specified diseases of biliary tract: Secondary | ICD-10-CM

## 2020-04-14 ENCOUNTER — Other Ambulatory Visit: Payer: Self-pay

## 2020-04-14 ENCOUNTER — Ambulatory Visit
Admission: RE | Admit: 2020-04-14 | Discharge: 2020-04-14 | Disposition: A | Discharge status: Home / Self Care | Payer: 59 | Source: Ambulatory Visit | Attending: Gastroenterology | Admitting: Gastroenterology

## 2020-04-14 DIAGNOSIS — R935 Abnormal findings on diagnostic imaging of other abdominal regions, including retroperitoneum: Secondary | ICD-10-CM

## 2020-04-14 DIAGNOSIS — K838 Other specified diseases of biliary tract: Secondary | ICD-10-CM

## 2020-04-14 MED ORDER — GADOBENATE DIMEGLUMINE 529 MG/ML IV SOLN
18.0000 mL | Freq: Once | INTRAVENOUS | Status: AC | PRN
  Administered 2020-04-14: 16:00:00 18 mL via INTRAVENOUS

## 2020-04-22 ENCOUNTER — Other Ambulatory Visit: Payer: Self-pay | Admitting: Gastroenterology

## 2020-04-29 NOTE — Progress Notes (Signed)
Patient called for preprocedure phone call.  Thinking about cancelling procedure per patient.  Instructed patient to call office of Dr Dulce Sellar.

## 2020-05-03 ENCOUNTER — Other Ambulatory Visit (HOSPITAL_COMMUNITY): Payer: 59

## 2020-05-07 ENCOUNTER — Encounter (HOSPITAL_COMMUNITY): Admission: RE | Payer: Self-pay | Source: Home / Self Care

## 2020-05-07 ENCOUNTER — Ambulatory Visit (HOSPITAL_COMMUNITY): Admission: RE | Admit: 2020-05-07 | Payer: 59 | Source: Home / Self Care | Admitting: Gastroenterology

## 2020-05-07 SURGERY — UPPER ENDOSCOPIC ULTRASOUND (EUS) LINEAR
Anesthesia: Monitor Anesthesia Care

## 2021-01-24 IMAGING — CT CT IMAGE GUIDED DRAINAGE BY PERCUTANEOUS CATHETER
1 series · 1 of 1 positions shown · non-contrast
Comparison: none

INDICATION: 58-year-old male with a history of ruptured appendicitis and abscess

[Series 1: topogram 0.6 t20f · coronal · 1.00mm/px · 1 of 1 slices shown]
[im 1/1]
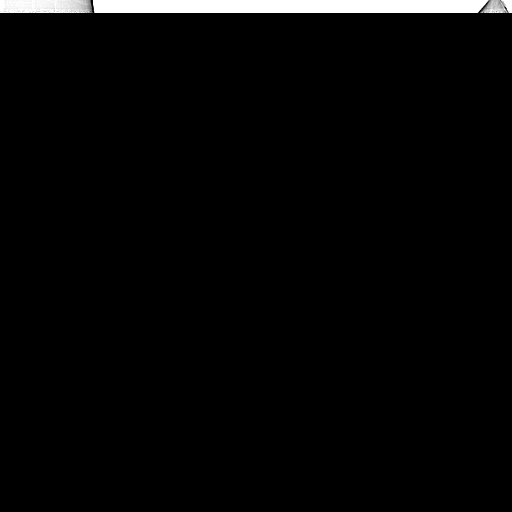

[1 of 1 positions shown; findings below may reference images not displayed]

EXAM:
CT-GUIDED DRAINAGE RIGHT LOWER QUADRANT ABSCESS

MEDICATIONS:
The patient is currently admitted to the hospital and receiving
intravenous antibiotics. The antibiotics were administered within an
appropriate time frame prior to the initiation of the procedure.

ANESTHESIA/SEDATION:
Fentanyl 100 mcg IV; Versed 2.0 mg IV

Moderate Sedation Time:  14 MINUTES

The patient was continuously monitored during the procedure by the
interventional radiology nurse under my direct supervision.

COMPLICATIONS:
NONE

PROCEDURE:
Informed written consent was obtained from the patient after a
thorough discussion of the procedural risks, benefits and
alternatives. All questions were addressed. Maximal Sterile Barrier
Technique was utilized including caps, mask, sterile gowns, sterile
gloves, sterile drape, hand hygiene and skin antiseptic. A timeout
was performed prior to the initiation of the procedure.

PATIENT POSITIONED SUPINE POSITION ON THE CT GANTRY TABLE AND A
SCOUT CT WAS ACQUIRED.

PATIENT IS PREPPED AND DRAPED IN THE USUAL STERILE FASHION. 1%
LIDOCAINE WAS USED FOR LOCAL ANESTHESIA.

USING CT GUIDANCE, 10 FRENCH PIGTAIL DRAINAGE CATHETER WAS PLACED
INTO THE ABSCESS OF THE RIGHT LOWER QUADRANT ASPIRATION CONFIRMED
LOCATION AND THE DRAIN WAS ATTACHED TO BULB SUCTION. SAMPLE SENT FOR
CULTURE.

PATIENT TOLERATED THE PROCEDURE WELL AND REMAINED HEMODYNAMICALLY
STABLE THROUGHOUT.

No complications were encountered and no significant blood loss
IMPRESSION: Status post CT-guided drainage of right lower quadrant abscess.

## 2021-11-05 HISTORY — PX: TOE AMPUTATION: SHX809

## 2021-12-28 ENCOUNTER — Other Ambulatory Visit: Payer: Self-pay

## 2021-12-28 ENCOUNTER — Encounter: Payer: Self-pay | Admitting: Family Medicine

## 2021-12-28 ENCOUNTER — Ambulatory Visit (INDEPENDENT_AMBULATORY_CARE_PROVIDER_SITE_OTHER): Payer: 59 | Admitting: Family Medicine

## 2021-12-28 VITALS — BP 140/80 | HR 77 | Temp 98.0°F | Ht 72.0 in | Wt 171.1 lb

## 2021-12-28 DIAGNOSIS — K859 Acute pancreatitis without necrosis or infection, unspecified: Secondary | ICD-10-CM | POA: Insufficient documentation

## 2021-12-28 DIAGNOSIS — Z0000 Encounter for general adult medical examination without abnormal findings: Secondary | ICD-10-CM

## 2021-12-28 DIAGNOSIS — K860 Alcohol-induced chronic pancreatitis: Secondary | ICD-10-CM

## 2021-12-28 DIAGNOSIS — K219 Gastro-esophageal reflux disease without esophagitis: Secondary | ICD-10-CM | POA: Diagnosis not present

## 2021-12-28 MED ORDER — OMEPRAZOLE 40 MG PO CPDR: 40.0000 mg | DELAYED_RELEASE_CAPSULE | Freq: Every day | ORAL | 3 refills | Status: DC

## 2021-12-28 NOTE — Progress Notes (Signed)
New Patient Office Visit  Subjective:  Patient ID: Jeremy Ewing, male    DOB: [birth year]  Age: 61 y.o. MRN: [id number]  CC:  Chief Complaint  Patient presents with   Establish Care    Would like a physical if possible     HPI Jeremy Ewing presents for new pt to [facility]-changed ins so has to change doctors  Pancreatitis ETOH in Dec 2021.Marland Kitchen Needs referral to GI-had "abscess" cyst.  ETOH use was 5-8/day.  Now down to 1-2 occ.   Was depressed during pandemic so turned to drinking ETOH.   No pain if eating but if ETOH-gets pain. GERD-on pantaprazole-no dysphagia.  Occ break thru.  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 IV fluid pain medication, history of pancreatitis in the past was seen by GI outpatient, CT scan shows acute on chronic pancreatitis with associated 7.3 cm thick-walled fluid collection in the pancreatic head/uncinate process, reflecting walled-off necrosis, mildly progressive,  GI consult appreciated who recommended follow-up as outpatient with main campus for possible ultrasound-guided aspiration of the pancreatic cyst."  11/24/21 note from GI Dr Boyd Kerbs ASSESSMENT AND PLAN: 61 yo M with PMHx HTN, perforated appendicitis 09/08/2019, recurrent pancreatitis (presumed from ETOH) index 02/22/20 who presents to clinic today for evaluation of 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 Since his index case of pancreatitis in 2021 he reports one recurrence requiring hospitalization ([facility] 11/06/21) related to ongoing ETOH intake. No gallstones on imaging, prior TG normal, Ca normal, no new medications prior to onset of index case and no FH pancreatitis/ pancreatic cancer. 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 During his hospitalization in December the CT  11/07/21 shows a 7.3 x 4.4 cm thick-walled low-density lesion in the pancreatic head/uncinate process, suggesting walled-off necrosis with associated main PD dilation.  He was seen and evaluated today with Dr. Boyd Kerbs. We reviewed prior imaging from 2021 through his most recent CT 11/07/21 during his hospitalization. Suspect ETOH is his cause for pancreatitis. Currently he has an area of WOPN, however he is completely asymptomatic.  --Recommend complete ETOH cessation - discussed with patient in depth and he is motivated to quit --Repeat imaging with CT pancreas at the end of January. Unfortunately, the patient has insurance changes and will need to be seen by [facility] providers. We will send records to [facility] per patient request and recommend CT there in ~6 weeks.  --If he develops obstructive symptoms such as early satiety, recurrent N/V, ongoing abdominal pain or fevers then he may need drainage of this necrotic collection. He demonstrated understanding.  Future diagnostic and therapeutic measures will be considered following receipt of these results. Thank you for asking [place] to evaluate your patient. Please do not hesitate to contact us if you have any questions regarding our evaluation and recommendations.  Electronically signed by: Tora Duck, PA-C, 11/24/2021 12:12 PM  This is a shared visit with the Advance Practice Provider. This patient was seen, discussed, and examined with the Advance Practice Provider. I agree with the note including assessment and plan.   The patient presents for evaluation of recurrent pancreatitis (secondary to alcohol) complicated by development of walled-off necrosis. CT 11/07/21 showed a 7.3 x 4.4 cm walled-off necrosis in the pancreatic head/uncinate process,with associated main PD dilation.  Exam as above.   The plan is to evaluate with repeat imaging in 4-6 weeks. Patient asymptomatic at this time. Lifestyle and dietary modifications d/w patient in detail. [facility]  [facility] given change in his insurance beginning next year.   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 No pertinent family history.  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   Social Determinants of Health   Financial Resource Strain: Not on file  Food Insecurity: Not on file  Transportation Needs: Not on file  Physical Activity: Not on file  Stress: Not on file  Social Connections: Not on file  Intimate Partner Violence: Not on file    ROS: negative/noncontributory except as in HPI   Objective:   Today's Vitals: BP 140/80    Pulse 77    Temp 98 F (36.7 C) (Temporal)    Ht 6' (1.829 m)    Wt 171 lb 2 oz (77.6 kg)    SpO2 98%    BMI 23.21 kg/m   Gen: WDWN NAD HEENT: NCAT, conjunctiva not injected, sclera nonicteric NECK:  supple, no thyromegaly, no nodes, no carotid bruits CARDIAC: RRR, S1S2+, no murmur.  LUNGS: CTAB. No wheezes ABDOMEN:  BS+, soft, mildly tender mid epi, No HSM, no masses EXT:  no edema MSK: no gross abnormalities.  NEURO: A&O x3.  CN II-XII intact.  PSYCH: normal mood. Good eye contact    Assessment & Plan:   Problem List Items Addressed This Visit       Digestive   GERD (gastroesophageal reflux disease) - Primary   Relevant Medications   omeprazole (PRILOSEC) 40 MG capsule   Pancreatitis   Relevant Medications   omeprazole (PRILOSEC) 40 MG capsule   GERD-pt wants to change to omeprazole 40mg .  Will let us know if not work.  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  Pt trying to abstain.  Check labs. Refer GI  CT due.  Spent 15min reviewing records  Outpatient Encounter Medications as of 12/28/2021  Medication Sig   omeprazole (PRILOSEC) 40 MG capsule Take 1 capsule (40 mg total) by mouth daily.   [DISCONTINUED] pantoprazole (PROTONIX) 40 MG tablet Take 1 tablet by mouth daily.   [DISCONTINUED] omeprazole (PRILOSEC) 20 MG capsule Take 20 mg by mouth daily.   [DISCONTINUED] XARELTO STARTER PACK Take 20 mg by mouth daily.   No facility-administered encounter medications on file as of 12/28/2021.  Follow-up: No follow-ups on file. 1 mo f/u cpx per insurance  Angelena Sole, MD

## 2021-12-28 NOTE — Patient Instructions (Signed)
Welcome to [facility]! It was a pleasure meeting you today.  As discussed, Please schedule a 1 month follow up visit today. Will set up GI appointment and CT scan-call if not hear anything.  Stay away from Alcohol.   PLEASE NOTE:  If you had any LAB tests please let us know if you have not heard back within a few days. You may see your results on MyChart before we have a chance to review them but we will give you a call once they are reviewed by [place]. If we ordered any REFERRALS today, please let us know if you have not heard from their office within the next week.  Let us know through MyChart if you are needing REFILLS, or have your pharmacy send [place] the request. You can also use MyChart to communicate with me or any office staff.  Please try these tips to maintain a healthy lifestyle:  Eat most of your calories during the day when you are active. Eliminate processed foods including packaged sweets (pies, cakes, cookies), reduce intake of potatoes, white bread, white pasta, and white rice. Look for whole grain options, oat flour or almond flour.  Each meal should contain half fruits/vegetables, one quarter protein, and one quarter carbs (no bigger than a computer mouse).  Cut down on sweet beverages. This includes juice, soda, and sweet tea. Also watch fruit intake, though this is a healthier sweet option, it still contains natural sugar! Limit to 3 servings daily.  Drink at least 1 glass of water with each meal and aim for at least 8 glasses per day  Exercise at least 150 minutes every week.

## 2021-12-29 LAB — LIPID PANEL
Cholesterol: 165 mg/dL (ref 0–200)
HDL: 40.6 mg/dL (ref >39.00)
LDL Cholesterol: 115 mg/dL — ABNORMAL HIGH (ref 0–99)
NonHDL: 124.55
Total CHOL/HDL Ratio: 4
Triglycerides: 48 mg/dL (ref 0.0–149.0)
VLDL: 9.6 mg/dL (ref 0.0–40.0)

## 2021-12-29 LAB — CBC
HCT: 39 % (ref 39.0–52.0)
Hemoglobin: 12.5 g/dL — ABNORMAL LOW (ref 13.0–17.0)
MCHC: 32.1 g/dL (ref 30.0–36.0)
MCV: 86.3 fl (ref 78.0–100.0)
Platelets: 464 10*3/uL — ABNORMAL HIGH (ref 150.0–400.0)
RBC: 4.52 Mil/uL (ref 4.22–5.81)
RDW: 15.4 % (ref 11.5–15.5)
WBC: 5.6 10*3/uL (ref 4.0–10.5)

## 2021-12-29 LAB — COMPREHENSIVE METABOLIC PANEL
ALT: 9 U/L (ref 0–53)
AST: 13 U/L (ref 0–37)
Albumin: 4.4 g/dL (ref 3.5–5.2)
Alkaline Phosphatase: 104 U/L (ref 39–117)
BUN: 14 mg/dL (ref 6–23)
CO2: 30 mEq/L (ref 19–32)
Calcium: 9.9 mg/dL (ref 8.4–10.5)
Chloride: 103 mEq/L (ref 96–112)
Creatinine, Ser: 1.04 mg/dL (ref 0.40–1.50)
GFR: 77.59 mL/min (ref >60.00)
Glucose, Bld: 92 mg/dL (ref 70–99)
Potassium: 4.9 mEq/L (ref 3.5–5.1)
Sodium: 140 mEq/L (ref 135–145)
Total Bilirubin: 0.7 mg/dL (ref 0.2–1.2)
Total Protein: 7 g/dL (ref 6.0–8.3)

## 2021-12-29 LAB — AMYLASE: Amylase: 251 U/L — ABNORMAL HIGH (ref 27–131)

## 2021-12-29 LAB — HEMOGLOBIN A1C: Hgb A1c MFr Bld: 5.4 % (ref 4.6–6.5)

## 2021-12-29 LAB — LIPASE: Lipase: 407 U/L — ABNORMAL HIGH (ref 11.0–59.0)

## 2021-12-29 LAB — VITAMIN B12: Vitamin B-12: 209 pg/mL — ABNORMAL LOW (ref 211–911)

## 2021-12-29 LAB — TSH: TSH: 1.34 u[IU]/mL (ref 0.35–5.50)

## 2022-01-18 ENCOUNTER — Other Ambulatory Visit: Payer: 59

## 2022-01-25 ENCOUNTER — Ambulatory Visit (INDEPENDENT_AMBULATORY_CARE_PROVIDER_SITE_OTHER): Payer: 59 | Admitting: Family Medicine

## 2022-01-25 ENCOUNTER — Other Ambulatory Visit: Payer: Self-pay

## 2022-01-25 ENCOUNTER — Encounter: Payer: Self-pay | Admitting: Family Medicine

## 2022-01-25 VITALS — BP 112/72 | HR 65 | Temp 97.7°F | Ht 72.0 in | Wt 174.5 lb

## 2022-01-25 DIAGNOSIS — Z0000 Encounter for general adult medical examination without abnormal findings: Secondary | ICD-10-CM | POA: Diagnosis not present

## 2022-01-25 DIAGNOSIS — Z125 Encounter for screening for malignant neoplasm of prostate: Secondary | ICD-10-CM

## 2022-01-25 DIAGNOSIS — K860 Alcohol-induced chronic pancreatitis: Secondary | ICD-10-CM | POA: Diagnosis not present

## 2022-01-25 LAB — PSA: PSA: 2.11 ng/mL (ref 0.10–4.00)

## 2022-01-25 LAB — LIPASE: Lipase: 40 U/L (ref 11.0–59.0)

## 2022-01-25 LAB — AMYLASE: Amylase: 100 U/L (ref 27–131)

## 2022-01-25 NOTE — Progress Notes (Signed)
Phone: [phone]   Subjective:  Patient 61 y.o. male presenting for annual physical.  Pt doing well as far as pancreas-no pain, etc.  CT canceled d/t insurance   Chief Complaint  Patient presents with   Annual Exam    Fasting Due for shingles     See problem oriented charting- ROS- full  review of systems was completed and negative  except for: not taking B12 yet.  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 No pertinent family history.  Medications- reviewed and updated Current Outpatient Medications  Medication Sig Dispense Refill   acetaminophen (TYLENOL) 500 MG tablet 2 tabs     pantoprazole (PROTONIX) 40 MG tablet Take 40 mg by mouth daily.     sildenafil (VIAGRA) 50 MG tablet 1 tablet as needed 30 min to 4 hours prior to sexual activity     No current facility-administered medications for this visit.    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 Anticipatory guidance: Patient counseled regarding regular dental exams q6 months, eye exams yearly, avoiding smoking and second hand smoke, limiting  alcohol to 2 beverages per day.   2. Risk factor reduction:  Advised patient of need for regular exercise and diet rich in fruits and vegetables to reduce risk of heart attack and stroke. Exercise- discussed.   Wt Readings from Last 3 Encounters:  01/25/22 174 lb 8 oz (79.2 kg)  12/28/21 171 lb 2 oz (77.6 kg)  09/09/19 218 lb 8 oz (99.1 kg)   3. Immunizations/screenings/ancillary studies Immunization History  Administered Date(s) Administered   MODERNA SARS COV-2 Pediatric Vaccination 5mos to <35yrs 07/17/2020   Tdap 05/26/2016   Health Maintenance Due  Topic Date Due   COLONOSCOPY (Pts 45-59yrs Insurance coverage will need to be confirmed)  Never done   Zoster Vaccines- Shingrix (1 of 2) Never done    4. Prostate cancer screening >62yo - risk factors? No results found for: PSA  5. Colon cancer screening:per pt done [facility] at [facility] and told 10 yrs 6. Skin cancer screening- advised regular sunscreen use. Denies worrisome, changing, or new skin lesions.  7. Smoking associated screening (lung cancer screening, AAA screen 65-75, UA)- non smoker-  8. STD screening - not SA for 3 yrs  Problem List Items Addressed This Visit       Digestive   Pancreatitis   Relevant Medications   pantoprazole (PROTONIX) 40 MG tablet   Other Relevant Orders  Amylase   Lipase   Other Visit Diagnoses     Wellness examination    -  Primary   Screening for malignant neoplasm of prostate       Relevant Orders   PSA      Wellness-discussed labs.  Work on [employer]. Pancreatitis-stopped ETOH-CT on hold d/t ins-will check on it.  Pt to call GI to sch appt.  No symptoms.  Reck amylase/lipase.    Recommended follow up: 6Return in about 6 months (around 07/25/2022) for pancreatitis. No future appointments.   Lab/Order associations:non fasting   ICD-10-CM   1. Wellness examination  Z00.00     2. Alcohol-induced chronic pancreatitis (HCC)  K86.0 Amylase    Lipase    3. Screening for malignant neoplasm of  prostate  Z12.5 PSA      No orders of the defined types were placed in this encounter.    Angelena Sole, MD

## 2022-01-25 NOTE — Patient Instructions (Addendum)
It was very nice to see you today!  [phone]  Take B12 vitamins   PLEASE NOTE:  If you had any lab tests please let us know if you have not heard back within a few days. You may see your results on MyChart before we have a chance to review them but we will give you a call once they are reviewed by [place]. If we ordered any referrals today, please let us know if you have not heard from their office within the next week.   Please try these tips to maintain a healthy lifestyle:  Eat most of your calories during the day when you are active. Eliminate processed foods including packaged sweets (pies, cakes, cookies), reduce intake of potatoes, white bread, white pasta, and white rice. Look for whole grain options, oat flour or almond flour.  Each meal should contain half fruits/vegetables, one quarter protein, and one quarter carbs (no bigger than a computer mouse).  Cut down on sweet beverages. This includes juice, soda, and sweet tea. Also watch fruit intake, though this is a healthier sweet option, it still contains natural sugar! Limit to 3 servings daily.  Drink at least 1 glass of water with each meal and aim for at least 8 glasses per day  Exercise at least 150 minutes every week.

## 2022-03-31 ENCOUNTER — Encounter: Payer: Self-pay | Admitting: Family Medicine

## 2022-06-11 ENCOUNTER — Ambulatory Visit (INDEPENDENT_AMBULATORY_CARE_PROVIDER_SITE_OTHER): Payer: 59 | Admitting: Family Medicine

## 2022-06-11 ENCOUNTER — Encounter: Payer: Self-pay | Admitting: Family Medicine

## 2022-06-11 VITALS — BP 138/90 | HR 88 | Temp 98.2°F | Ht 72.0 in | Wt 178.5 lb

## 2022-06-11 DIAGNOSIS — R030 Elevated blood-pressure reading, without diagnosis of hypertension: Secondary | ICD-10-CM

## 2022-06-11 NOTE — Patient Instructions (Addendum)
It was very nice to see you today!  New batteries in bp machine Slowly decrease alcohol Decrease salt Continue treadmill-work way up to goal of .   If bp remains >145/95, let me know, otherwise give 3 months.   Get multivitamin and stay on B12.    PLEASE NOTE:  If you had any lab tests please let us know if you have not heard back within a few days. You may see your results on MyChart before we have a chance to review them but we will give you a call once they are reviewed by [place]. If we ordered any referrals today, please let us know if you have not heard from their office within the next week.   Please try these tips to maintain a healthy lifestyle:  Eat most of your calories during the day when you are active. Eliminate processed foods including packaged sweets (pies, cakes, cookies), reduce intake of potatoes, white bread, white pasta, and white rice. Look for whole grain options, oat flour or almond flour.  Each meal should contain half fruits/vegetables, one quarter protein, and one quarter carbs (no bigger than a computer mouse).  Cut down on sweet beverages. This includes juice, soda, and sweet tea. Also watch fruit intake, though this is a healthier sweet option, it still contains natural sugar! Limit to 3 servings daily.  Drink at least 1 glass of water with each meal and aim for at least 8 glasses per day  Exercise at least 150 minutes every week.

## 2022-06-11 NOTE — Progress Notes (Signed)
   Subjective:     Patient ID: Jeremy Ewing, male    DOB: 1961/10/25, 61 y.o.   MRN: [id number]  Chief Complaint  Patient presents with   Discuss blood pressure    Blood pressure has been fluctuating recently Currently not taking any medications      HPI  Elevated bp-has been checking 3-4 yrs-checks daily.  137/98 this am on treadmill. Bp increased past 2 wks.  130-140/90's.  Eating more salt.  Exercises daily. ETOH 8/day.   Treadmill 68min/day.   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 (Patient not taking: Reported on 06/11/2022)     sildenafil (VIAGRA) 50 MG tablet 1 tablet as needed 30 min to 4 hours prior to sexual activity (Patient not taking: Reported on 06/11/2022)     No facility-administered medications prior to visit.    No Known Allergies ROS neg/noncontributory except as noted HPI/below      Objective:     BP 138/90   Pulse 88   Temp 98.2 F (36.8 C) (Temporal)   Ht 6' (1.829 m)   Wt 178 lb 8 oz (81 kg)   SpO2 98%   BMI 24.21 kg/m  Wt Readings from Last 3 Encounters:  06/11/22 178 lb 8 oz (81 kg)  01/25/22 174 lb 8 oz (79.2 kg)  12/28/21 171 lb 2 oz (77.6 kg)    Physical Exam   Gen: WDWN NAD aam HEENT: NCAT, conjunctiva not injected, sclera nonicteric NECK:  supple, no thyromegaly, no nodes, no carotid  bruits CARDIAC: RRR, S1S2+, no murmur. DP 2+B LUNGS: CTAB. No wheezes ABDOMEN:  BS+, soft, NTND, No HSM, no masses EXT:  no edema MSK: no gross abnormalities.  NEURO: A&O x3.  CN II-XII intact.  PSYCH: normal mood. Good eye contact     Assessment & Plan:   Problem List Items Addressed This Visit   None Visit Diagnoses     Elevated blood pressure reading    -  Primary      Elevated blood pressure-prob from ETOH abuse and salt.  Work on dec ETOH, salt.  Work up to 66min/day on treadmill.  Monitor bp's  if remain >145/95, consider tx.  F/u 3 mo-sooner if needed.    No orders of the defined types were placed in this encounter.   Angelena Sole, MD

## 2022-08-30 ENCOUNTER — Encounter: Payer: Self-pay | Admitting: *Deleted

## 2022-09-23 ENCOUNTER — Ambulatory Visit: Payer: Self-pay | Admitting: Family Medicine

## 2022-09-30 ENCOUNTER — Emergency Department (HOSPITAL_BASED_OUTPATIENT_CLINIC_OR_DEPARTMENT_OTHER): Payer: Commercial Managed Care - HMO

## 2022-09-30 ENCOUNTER — Encounter (HOSPITAL_BASED_OUTPATIENT_CLINIC_OR_DEPARTMENT_OTHER): Payer: Self-pay

## 2022-09-30 ENCOUNTER — Encounter (HOSPITAL_COMMUNITY): Payer: Self-pay

## 2022-09-30 ENCOUNTER — Other Ambulatory Visit: Payer: Self-pay

## 2022-09-30 ENCOUNTER — Inpatient Hospital Stay (HOSPITAL_BASED_OUTPATIENT_CLINIC_OR_DEPARTMENT_OTHER)
Admission: EM | Admit: 2022-09-30 | Discharge: 2022-10-10 | DRG: 438 | Disposition: A | Discharge status: Home / Self Care | Payer: Commercial Managed Care - HMO | Attending: Internal Medicine | Admitting: Internal Medicine

## 2022-09-30 ENCOUNTER — Ambulatory Visit (INDEPENDENT_AMBULATORY_CARE_PROVIDER_SITE_OTHER): Payer: Commercial Managed Care - HMO | Admitting: Family Medicine

## 2022-09-30 ENCOUNTER — Encounter: Payer: Self-pay | Admitting: Family Medicine

## 2022-09-30 VITALS — BP 128/94 | HR 111 | Temp 97.9°F | Wt 176.6 lb

## 2022-09-30 DIAGNOSIS — B360 Pityriasis versicolor: Secondary | ICD-10-CM | POA: Diagnosis present

## 2022-09-30 DIAGNOSIS — R7989 Other specified abnormal findings of blood chemistry: Secondary | ICD-10-CM | POA: Diagnosis not present

## 2022-09-30 DIAGNOSIS — I10 Essential (primary) hypertension: Secondary | ICD-10-CM | POA: Diagnosis present

## 2022-09-30 DIAGNOSIS — D649 Anemia, unspecified: Secondary | ICD-10-CM | POA: Diagnosis present

## 2022-09-30 DIAGNOSIS — R030 Elevated blood-pressure reading, without diagnosis of hypertension: Secondary | ICD-10-CM

## 2022-09-30 DIAGNOSIS — E872 Acidosis, unspecified: Secondary | ICD-10-CM | POA: Diagnosis present

## 2022-09-30 DIAGNOSIS — K219 Gastro-esophageal reflux disease without esophagitis: Secondary | ICD-10-CM | POA: Diagnosis present

## 2022-09-30 DIAGNOSIS — D75838 Other thrombocytosis: Secondary | ICD-10-CM | POA: Diagnosis present

## 2022-09-30 DIAGNOSIS — K831 Obstruction of bile duct: Secondary | ICD-10-CM | POA: Diagnosis present

## 2022-09-30 DIAGNOSIS — F1090 Alcohol use, unspecified, uncomplicated: Secondary | ICD-10-CM

## 2022-09-30 DIAGNOSIS — K8689 Other specified diseases of pancreas: Secondary | ICD-10-CM | POA: Diagnosis present

## 2022-09-30 DIAGNOSIS — R112 Nausea with vomiting, unspecified: Secondary | ICD-10-CM | POA: Diagnosis present

## 2022-09-30 DIAGNOSIS — R1084 Generalized abdominal pain: Secondary | ICD-10-CM

## 2022-09-30 DIAGNOSIS — F1010 Alcohol abuse, uncomplicated: Secondary | ICD-10-CM

## 2022-09-30 DIAGNOSIS — K852 Alcohol induced acute pancreatitis without necrosis or infection: Principal | ICD-10-CM | POA: Diagnosis present

## 2022-09-30 DIAGNOSIS — K860 Alcohol-induced chronic pancreatitis: Secondary | ICD-10-CM | POA: Diagnosis not present

## 2022-09-30 DIAGNOSIS — E860 Dehydration: Secondary | ICD-10-CM | POA: Diagnosis present

## 2022-09-30 DIAGNOSIS — N179 Acute kidney failure, unspecified: Secondary | ICD-10-CM | POA: Diagnosis present

## 2022-09-30 DIAGNOSIS — E876 Hypokalemia: Secondary | ICD-10-CM | POA: Diagnosis present

## 2022-09-30 DIAGNOSIS — K859 Acute pancreatitis without necrosis or infection, unspecified: Secondary | ICD-10-CM | POA: Diagnosis present

## 2022-09-30 DIAGNOSIS — R251 Tremor, unspecified: Secondary | ICD-10-CM

## 2022-09-30 DIAGNOSIS — R319 Hematuria, unspecified: Secondary | ICD-10-CM

## 2022-09-30 DIAGNOSIS — K863 Pseudocyst of pancreas: Secondary | ICD-10-CM | POA: Diagnosis present

## 2022-09-30 DIAGNOSIS — E871 Hypo-osmolality and hyponatremia: Secondary | ICD-10-CM | POA: Diagnosis present

## 2022-09-30 DIAGNOSIS — K861 Other chronic pancreatitis: Secondary | ICD-10-CM | POA: Diagnosis not present

## 2022-09-30 DIAGNOSIS — F10139 Alcohol abuse with withdrawal, unspecified: Secondary | ICD-10-CM | POA: Diagnosis present

## 2022-09-30 DIAGNOSIS — K760 Fatty (change of) liver, not elsewhere classified: Secondary | ICD-10-CM | POA: Diagnosis present

## 2022-09-30 DIAGNOSIS — R21 Rash and other nonspecific skin eruption: Secondary | ICD-10-CM

## 2022-09-30 LAB — URINALYSIS, ROUTINE W REFLEX MICROSCOPIC
Glucose, UA: NEGATIVE mg/dL
Ketones, ur: >80 mg/dL — AB
Leukocytes,Ua: NEGATIVE
Nitrite: NEGATIVE
Protein, ur: >300 mg/dL — AB
RBC / HPF: >50 RBC/hpf — ABNORMAL HIGH (ref 0–5)
Specific Gravity, Urine: 1.027 (ref 1.005–1.030)
pH: 6.5 (ref 5.0–8.0)

## 2022-09-30 LAB — CBC
HCT: 40.5 % (ref 39.0–52.0)
Hemoglobin: 13.3 g/dL (ref 13.0–17.0)
MCH: 32 pg (ref 26.0–34.0)
MCHC: 32.8 g/dL (ref 30.0–36.0)
MCV: 97.6 fL (ref 80.0–100.0)
Platelets: 317 10*3/uL (ref 150–400)
RBC: 4.15 MIL/uL — ABNORMAL LOW (ref 4.22–5.81)
RDW: 14.7 % (ref 11.5–15.5)
WBC: 7.8 10*3/uL (ref 4.0–10.5)
nRBC: 0 % (ref 0.0–0.2)

## 2022-09-30 LAB — COMPREHENSIVE METABOLIC PANEL
ALT: 56 U/L — ABNORMAL HIGH (ref 0–44)
AST: 83 U/L — ABNORMAL HIGH (ref 15–41)
Albumin: 4.6 g/dL (ref 3.5–5.0)
Alkaline Phosphatase: 129 U/L — ABNORMAL HIGH (ref 38–126)
Anion gap: 18 — ABNORMAL HIGH (ref 5–15)
BUN: 12 mg/dL (ref 8–23)
CO2: 19 mmol/L — ABNORMAL LOW (ref 22–32)
Calcium: 9.8 mg/dL (ref 8.9–10.3)
Chloride: 95 mmol/L — ABNORMAL LOW (ref 98–111)
Creatinine, Ser: 1.3 mg/dL — ABNORMAL HIGH (ref 0.61–1.24)
GFR, Estimated: >60 mL/min (ref >60)
Glucose, Bld: 91 mg/dL (ref 70–99)
Potassium: 4.3 mmol/L (ref 3.5–5.1)
Sodium: 132 mmol/L — ABNORMAL LOW (ref 135–145)
Total Bilirubin: 1.3 mg/dL — ABNORMAL HIGH (ref 0.3–1.2)
Total Protein: 8.6 g/dL — ABNORMAL HIGH (ref 6.5–8.1)

## 2022-09-30 LAB — LIPASE, BLOOD: Lipase: 2156 U/L — ABNORMAL HIGH (ref 11–51)

## 2022-09-30 MED ORDER — THIAMINE MONONITRATE 100 MG PO TABS
100.0000 mg | ORAL_TABLET | Freq: Every day | ORAL | Status: DC
  Administered 2022-10-01 – 2022-10-10 (×9): 100 mg via ORAL
  Filled 2022-09-30 (×10): qty 1

## 2022-09-30 MED ORDER — PANTOPRAZOLE SODIUM 40 MG IV SOLR
INTRAVENOUS | Status: AC
  Filled 2022-09-30: qty 20

## 2022-09-30 MED ORDER — ONDANSETRON HCL 4 MG/2ML IJ SOLN
4.0000 mg | Freq: Four times a day (QID) | INTRAMUSCULAR | Status: DC | PRN
  Administered 2022-10-02 – 2022-10-04 (×3): 4 mg via INTRAVENOUS
  Filled 2022-09-30 (×3): qty 2

## 2022-09-30 MED ORDER — PANTOPRAZOLE 80MG IVPB - SIMPLE MED
80.0000 mg | Freq: Once | INTRAVENOUS | Status: AC
  Administered 2022-09-30: 80 mg via INTRAVENOUS
  Filled 2022-09-30: qty 100

## 2022-09-30 MED ORDER — FLUCONAZOLE 100 MG PO TABS
300.0000 mg | ORAL_TABLET | ORAL | Status: DC
  Administered 2022-10-01: 300 mg via ORAL
  Filled 2022-09-30: qty 3

## 2022-09-30 MED ORDER — THIAMINE HCL 100 MG/ML IJ SOLN
100.0000 mg | Freq: Every day | INTRAMUSCULAR | Status: DC
  Administered 2022-10-08: 100 mg via INTRAVENOUS
  Filled 2022-09-30: qty 2

## 2022-09-30 MED ORDER — IOHEXOL 300 MG/ML  SOLN
100.0000 mL | Freq: Once | INTRAMUSCULAR | Status: AC | PRN
  Administered 2022-09-30: 85 mL via INTRAVENOUS

## 2022-09-30 MED ORDER — LORAZEPAM 2 MG/ML IJ SOLN
0.0000 mg | Freq: Two times a day (BID) | INTRAMUSCULAR | Status: DC
  Administered 2022-10-03: 2 mg via INTRAVENOUS
  Filled 2022-09-30: qty 1

## 2022-09-30 MED ORDER — SILDENAFIL CITRATE 50 MG PO TABS: ORAL_TABLET | ORAL | 3 refills | Status: DC

## 2022-09-30 MED ORDER — LACTATED RINGERS IV SOLN: INTRAVENOUS | Status: DC

## 2022-09-30 MED ORDER — THIAMINE HCL 100 MG/ML IJ SOLN
200.0000 mg | Freq: Once | INTRAMUSCULAR | Status: AC
  Administered 2022-09-30: 200 mg via INTRAVENOUS
  Filled 2022-09-30: qty 2

## 2022-09-30 MED ORDER — POLYETHYLENE GLYCOL 3350 17 G PO PACK: 17.0000 g | PACK | Freq: Every day | ORAL | Status: DC | PRN

## 2022-09-30 MED ORDER — LORAZEPAM 2 MG/ML IJ SOLN
0.0000 mg | Freq: Four times a day (QID) | INTRAMUSCULAR | Status: AC
  Administered 2022-09-30: 1 mg via INTRAVENOUS
  Filled 2022-09-30: qty 1

## 2022-09-30 MED ORDER — HYDROMORPHONE HCL 1 MG/ML IJ SOLN
0.5000 mg | INTRAMUSCULAR | Status: DC | PRN
  Administered 2022-09-30 – 2022-10-01 (×2): 0.5 mg via INTRAVENOUS
  Filled 2022-09-30 (×2): qty 0.5

## 2022-09-30 MED ORDER — HYDROMORPHONE HCL 1 MG/ML IJ SOLN
0.5000 mg | Freq: Once | INTRAMUSCULAR | Status: AC
  Administered 2022-09-30: 0.5 mg via INTRAVENOUS
  Filled 2022-09-30: qty 1

## 2022-09-30 MED ORDER — LORAZEPAM 1 MG PO TABS
0.0000 mg | ORAL_TABLET | Freq: Two times a day (BID) | ORAL | Status: DC
  Administered 2022-10-03: 1 mg via ORAL
  Filled 2022-09-30: qty 1

## 2022-09-30 MED ORDER — LACTATED RINGERS IV BOLUS
1000.0000 mL | Freq: Once | INTRAVENOUS | Status: AC
  Administered 2022-09-30: 1000 mL via INTRAVENOUS

## 2022-09-30 MED ORDER — LORAZEPAM 1 MG PO TABS
0.0000 mg | ORAL_TABLET | Freq: Four times a day (QID) | ORAL | Status: AC
  Administered 2022-10-01 – 2022-10-02 (×3): 1 mg via ORAL
  Filled 2022-09-30 (×3): qty 1

## 2022-09-30 MED ORDER — MELATONIN 5 MG PO TABS
5.0000 mg | ORAL_TABLET | Freq: Every evening | ORAL | Status: DC | PRN
  Administered 2022-10-08 – 2022-10-09 (×2): 5 mg via ORAL
  Filled 2022-09-30 (×3): qty 1

## 2022-09-30 MED ORDER — ONDANSETRON HCL 4 MG/2ML IJ SOLN
4.0000 mg | Freq: Once | INTRAMUSCULAR | Status: AC
  Administered 2022-09-30: 4 mg via INTRAVENOUS
  Filled 2022-09-30: qty 2

## 2022-09-30 MED ORDER — FLUCONAZOLE 100 MG PO TABS: 300.0000 mg | ORAL_TABLET | ORAL | Status: DC

## 2022-09-30 MED ORDER — KETOCONAZOLE 2 % EX SHAM
1.0000 | MEDICATED_SHAMPOO | CUTANEOUS | 1 refills | Status: DC
Start: 2022-09-30 — End: 2022-10-08

## 2022-09-30 MED ORDER — LORAZEPAM 2 MG/ML IJ SOLN
1.0000 mg | Freq: Once | INTRAMUSCULAR | Status: AC
  Administered 2022-09-30: 1 mg via INTRAVENOUS
  Filled 2022-09-30: qty 1

## 2022-09-30 NOTE — ED Notes (Signed)
Pt resting and was asleep when I entered the room. Pt woke up stated that he had not had sleep in a while, pt stated that he felt ok at this time and denied any pain.

## 2022-09-30 NOTE — Patient Instructions (Signed)
Go to emergency room

## 2022-09-30 NOTE — Plan of Care (Signed)
Called for direct admission by Dr Ashok Cordia.  61 y/o male with ETOH use who presents for abdominal pain, nausea, poor oral intake and is found to have acute pancreatitis. CT suggestive of pancreatitis and Lipase elevated. Appears to be dehydrated and is tachycardic.  He does have a h/o getting "shaky" when he tries to quit drinking. Started on CIWA scale and given fluid boluses. Dr Ashok Cordia plans to order continuous fluids and PRN pain meds. No cardiac history. Will request a med/surg bed for acute pancreatitis.

## 2022-09-30 NOTE — ED Provider Notes (Signed)
[facility] EMERGENCY DEPT Provider Note   CSN: [id number] Arrival date & time: 09/30/22  1209     History  Chief Complaint  Patient presents with   Abdominal Pain    Jeremy Ewing is a 61 y.o. male.  Pt c/o mid abd pain in the past few days. C/o dull pain mid abd, comes and goes, without specific exacerbating or alleviating factors. Intermittent nausea. No vomiting. Having normal bms. No fever or chills. Denies back/flank pain. Hx heavy etoh use, when stops drinking will feel shaky, but denies hx dts or seizures. Was seen at pcp office and ent to ED for evaluation.   The history is provided by the patient and medical records.  Abdominal Pain Associated symptoms: nausea   Associated symptoms: no chest pain, no chills, no cough, no diarrhea, no dysuria, no fever, no shortness of breath, no sore throat and no vomiting        Home Medications Prior to Admission medications   Medication Sig Start Date End Date Taking? Authorizing Provider  acetaminophen (TYLENOL) 500 MG tablet     [provider]  ketoconazole (NIZORAL) 2 % shampoo Apply 1 Application topically 2 (two) times a week. 09/30/22   Jeani Sow, MD  sildenafil (VIAGRA) 50 MG tablet 1 tablet as needed 30 min to 4 hours prior to sexual activity 09/30/22   Jeani Sow, MD      Allergies    Patient has no known allergies.    Review of Systems   Review of Systems  Constitutional:  Negative for chills and fever.  HENT:  Negative for sore throat.   Eyes:  Negative for redness.  Respiratory:  Negative for cough and shortness of breath.   Cardiovascular:  Negative for chest pain and leg swelling.  Gastrointestinal:  Positive for abdominal pain and nausea. Negative for blood in stool, diarrhea and vomiting.  Genitourinary:  Negative for dysuria and flank pain.  Musculoskeletal:  Negative for back pain and neck pain.  Skin:  Negative for rash.  Neurological:  Negative for headaches.   Hematological:  Does not bruise/bleed easily.  Psychiatric/Behavioral:  Negative for confusion.     Physical Exam Updated Vital Signs BP (!) 137/100   Pulse (!) 125   Temp 97.9 F (36.6 C) (Oral)   Resp 20   Ht 1.829 m (6')   Wt 80.1 kg   SpO2 100%   BMI 23.95 kg/m  Physical Exam Vitals and nursing note reviewed.  Constitutional:      Appearance: Normal appearance. He is well-developed.  HENT:     Head: Atraumatic.     Nose: Nose normal.     Mouth/Throat:     Mouth: Mucous membranes are moist.     Pharynx: Oropharynx is clear.  Eyes:     General: No scleral icterus.    Conjunctiva/sclera: Conjunctivae normal.  Neck:     Trachea: No tracheal deviation.  Cardiovascular:     Rate and Rhythm: Normal rate and regular rhythm.     Pulses: Normal pulses.     Heart sounds: Normal heart sounds. No murmur heard.    No friction rub. No gallop.  Pulmonary:     Effort: Pulmonary effort is normal. No accessory muscle usage or respiratory distress.     Breath sounds: Normal breath sounds.  Abdominal:     General: Bowel sounds are normal. There is no distension.     Palpations: Abdomen is soft. There is no mass.  Tenderness: There is abdominal tenderness. There is no guarding or rebound.     Hernia: No hernia is present.     Comments: Mid abd tenderness.   Genitourinary:    Comments: No cva tenderness. Musculoskeletal:        General: No swelling or tenderness.     Cervical back: Normal range of motion and neck supple. No rigidity.     Right lower leg: No edema.     Left lower leg: No edema.  Skin:    General: Skin is warm and dry.     Findings: No rash.  Neurological:     Mental Status: He is alert.     Comments: Alert, speech clear. Motor/sens grossly intact bil. No gross tremor or shakes.   Psychiatric:        Mood and Affect: Mood normal.     ED Results / Procedures / Treatments   Labs (all labs ordered are listed, but only abnormal results are  displayed) Results for orders placed or performed during the hospital encounter of 09/30/22  Lipase, blood  Result Value Ref Range   Lipase 2,156 (H) 11 - 51 U/L  Comprehensive metabolic panel  Result Value Ref Range   Sodium 132 (L) 135 - 145 mmol/L   Potassium 4.3 3.5 - 5.1 mmol/L   Chloride 95 (L) 98 - 111 mmol/L   CO2 19 (L) 22 - 32 mmol/L   Glucose, Bld 91 70 - 99 mg/dL   BUN 12 8 - 23 mg/dL   Creatinine, Ser 6.96 (H) 0.61 - 1.24 mg/dL   Calcium 9.8 8.9 - 78.9 mg/dL   Total Protein 8.6 (H) 6.5 - 8.1 g/dL   Albumin 4.6 3.5 - 5.0 g/dL   AST 83 (H) 15 - 41 U/L   ALT 56 (H) 0 - 44 U/L   Alkaline Phosphatase 129 (H) 38 - 126 U/L   Total Bilirubin 1.3 (H) 0.3 - 1.2 mg/dL   GFR, Estimated >38 >10 mL/min   Anion gap 18 (H) 5 - 15  CBC  Result Value Ref Range   WBC 7.8 4.0 - 10.5 K/uL   RBC 4.15 (L) 4.22 - 5.81 MIL/uL   Hemoglobin 13.3 13.0 - 17.0 g/dL   HCT 17.5 10.2 - 58.5 %   MCV 97.6 80.0 - 100.0 fL   MCH 32.0 26.0 - 34.0 pg   MCHC 32.8 30.0 - 36.0 g/dL   RDW 27.7 82.4 - 23.5 %   Platelets 317 150 - 400 K/uL   nRBC 0.0 0.0 - 0.2 %  Urinalysis, Routine w reflex microscopic Urine, Clean Catch  Result Value Ref Range   Color, Urine YELLOW YELLOW   APPearance HAZY (A) CLEAR   Specific Gravity, Urine 1.027 1.005 - 1.030   pH 6.5 5.0 - 8.0   Glucose, UA NEGATIVE NEGATIVE mg/dL   Hgb urine dipstick LARGE (A) NEGATIVE   Bilirubin Urine SMALL (A) NEGATIVE   Ketones, ur >80 (A) NEGATIVE mg/dL   Protein, ur >361 (A) NEGATIVE mg/dL   Nitrite NEGATIVE NEGATIVE   Leukocytes,Ua NEGATIVE NEGATIVE   RBC / HPF >50 (H) 0 - 5 RBC/hpf   WBC, UA 0-5 0 - 5 WBC/hpf   Bacteria, UA FEW (A) NONE SEEN   Squamous Epithelial / LPF 0-5 0 - 5   Mucus PRESENT    Hyaline Casts, UA PRESENT      EKG None  Radiology CT Abdomen Pelvis W Contrast  Result Date: 09/30/2022 CLINICAL DATA:  Abdominal pain,  acute, nonlocalized generalized pain. pancreatitis EXAM: CT ABDOMEN AND PELVIS WITH  CONTRAST TECHNIQUE: Multidetector CT imaging of the abdomen and pelvis was performed using the standard protocol following bolus administration of intravenous contrast. RADIATION DOSE REDUCTION: This exam was performed according to the departmental dose-optimization program which includes automated exposure control, adjustment of the mA and/or kV according to patient size and/or use of iterative reconstruction technique. CONTRAST:  77mL OMNIPAQUE IOHEXOL 300 MG/ML  SOLN COMPARISON:  11/07/2021 FINDINGS: Lower chest: No acute abnormality Hepatobiliary: Diffuse low-density compatible with fatty infiltration. No focal hepatic abnormality. Gallbladder grossly unremarkable. There is intrahepatic and extrahepatic biliary ductal dilatation, likely related to pancreatic head cystic mass. Pancreas: 4.9 x 3.0 x 2.8 cm cystic mass within the pancreatic head. Associated pancreatic ductal dilatation measuring up to 11 mm and biliary ductal dilatation. Mild stranding adjacent to the pancreas. Overall, the cystic mass has decreased in size since prior study and the ductal dilatation is similar to prior study. 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 Spleen: No focal abnormality.  Normal size. Adrenals/Urinary Tract: No adrenal abnormality. No focal renal abnormality. No stones or hydronephrosis. Urinary bladder is unremarkable. Stomach/Bowel: Normal appendix. Scattered colonic diverticulosis. No active diverticulitis. Stomach and small bowel decompressed, unremarkable. Vascular/Lymphatic: Aortic atherosclerosis. No evidence of aneurysm or adenopathy. Reproductive: No visible focal abnormality. Other: No free fluid or free air. Musculoskeletal: No acute bony abnormality or suspicious focal bone lesion. IMPRESSION: Continued large cystic area within the pancreatic head, but decreasing in size overall since prior study. Mild stranding around the pancreas. Associated pancreatic ductal and  biliary ductal dilatation which is stable since prior study. Given the improvement since previous study, I favor this is related to pancreatitis with pseudocyst in the pancreatic head. Continued follow-up to resolution recommended. Colonic diverticulosis. Fatty liver. Aortic atherosclerosis. Electronically Signed   By: Charlett Nose M.D.   On: 09/30/2022 14:42    Procedures Procedures    Medications Ordered in ED Medications - No data to display  ED Course/ Medical Decision Making/ A&P                           Medical Decision Making Problems Addressed: Alcohol use disorder: chronic illness or injury that poses a threat to life or bodily functions Alcohol-induced acute pancreatitis without infection or necrosis: acute illness or injury with systemic symptoms that poses a threat to life or bodily functions Dehydration: acute illness or injury with systemic symptoms that poses a threat to life or bodily functions Elevated blood pressure reading: acute illness or injury Hematuria, unspecified type: acute illness or injury Pancreatic pseudocyst: acute illness or injury  Amount and/or Complexity of Data Reviewed External Data Reviewed: labs, radiology and notes. Labs: ordered. Decision-making details documented in ED Course. Radiology: ordered and independent interpretation performed. Decision-making details documented in ED Course. Discussion of management or test interpretation with external provider(s): hospitalist  Risk Prescription drug management. Parenteral controlled substances. Decision regarding hospitalization.   Iv ns. Continuous pulse ox and cardiac monitoring. Labs ordered/sent. Imaging ordered.   Reviewed nursing notes and prior charts for additional history. External reports reviewed. Additional history from:  Cardiac monitor: sinus rhythm, rate 120.  Labs reviewed/interpreted by me - mild inc cr. Elevated lipase c/w pancreatitis.   CT reviewed/interpreted by me -  pancreatitis w pseudocyst.  Iv LR bolus. Zofran iv. Protonix iv. Dilaudid iv.   Thiamine iv. Ativan.   Recheck pt  calm, no tremor or shakes.   Additional ivf.   Given nausea, abd pain, etoh use disorder/withdrawal, acute pancreatitis, dehydration - will admit.  Hospitalist consulted for admission.             Final Clinical Impression(s) / ED Diagnoses Final diagnoses:  None    Rx / DC Orders ED Discharge Orders     None         Lajean Saver, MD 09/30/22 1457

## 2022-09-30 NOTE — H&P (Addendum)
History and Physical  Jeremy Ewing OZD:[id number] DOB: 11-15-1961 DOA: 09/30/2022  Referring physician: Accepted by Dr. Renaldo Reel [facility], Hospitalist service  PCP: Jeani Sow, MD  Outpatient Specialists: None Patient coming from: Home through [facility] ED.  Chief Complaint: Epigastric pain.   HPI: Jeremy Ewing is a 61 y.o. male with medical history significant for alcohol abuse, who initially presented to [facility] ED with complaints of intermittent epigastric pain of a few days duration.  Associated with nausea without vomiting, and poor oral intake.    In the ED, work up revealed acute pancreatitis as evidenced by epigastric pain, elevated lipase level greater than 2000, CT evidence of acute pancreatitis.  The patient was started on IV fluid hydration in the ED and pain control.  Due to concern for alcohol withdrawal he was started on CIWA protocol.  The patient was accepted, admitted by [facility], hospitalist service, Dr. Reisner Denmark, and transferred to [facility] MedSurg unit as inpatient status.  Additionally, endorses itching on his chest and back.  States he was prescribed a shampoo for his fungal skin infection. He has had a difficult time applying the shampoo in these areas.  ED Course: Tmax 98.1.  BP 143/97, pulse 105, respiration rate 19, O2 saturation 99% on room air.  Lab studies markable for serum sodium 132, chloride 95, serum bicarb 19, creatinine 1.30, baseline creatinine 1.0 with GFR greater than 60.  Anion gap 16, alkaline phosphatase 125, lipase 2156, AST 83, ALT 56, T bilirubin 1.3.  CBC essentially unremarkable.  Review of Systems: Review of systems as noted in the HPI. All other systems reviewed and are negative.   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 He has never used smokeless tobacco. He reports current alcohol use. He reports that he does not use drugs.   No Known Allergies  Family history:   Prior to Admission medications   Medication Sig Start Date End Date Taking? Authorizing Provider  acetaminophen (TYLENOL) 500 MG tablet    Yes [provider]  ketoconazole (NIZORAL) 2 % shampoo Apply 1 Application topically 2 (two) times a week. Patient not taking: Reported on 09/30/2022 09/30/22   Jeani Sow, MD  sildenafil (VIAGRA) 50 MG tablet 1 tablet as needed 30 min to 4 hours prior to sexual activity Patient not taking: Reported on 09/30/2022 09/30/22   Jeani Sow, MD    Physical Exam: BP (!) 143/97 (BP Location: Left Arm)   Pulse (!) 105   Temp 98.1 F (36.7 C) (Oral)   Resp 19   Ht 6' (1.829 m)   Wt 80.1 kg   SpO2 99%   BMI 23.95 kg/m   General: 61 y.o. year-old male well developed well nourished in no acute distress.  Somnolent but easily arousable to voices. Cardiovascular: Regular rate and rhythm with no rubs or gallops.  No thyromegaly or JVD noted.  No lower extremity edema. 2/4 pulses in all 4 extremities. Respiratory: Clear to auscultation with no wheezes or rales. Good inspiratory effort. Abdomen: Soft epigastric tenderness with palpation.  Nondistended with normal bowel sounds  x4 quadrants. Muskuloskeletal: No cyanosis, clubbing or edema noted bilaterally Neuro: CN II-XII intact, strength, sensation, reflexes Skin: No ulcerative lesions noted.  Areas of hypopigmentation on chest and back. Psychiatry: Mood is appropriate for condition and setting          Labs on Admission:  Basic Metabolic Panel: Recent Labs  Lab 09/30/22 1223  NA 132*  K 4.3  CL 95*  CO2 19*  GLUCOSE 91  BUN 12  CREATININE 1.30*  CALCIUM 9.8   Liver Function Tests: Recent  Labs  Lab 09/30/22 1223  AST 83*  ALT 56*  ALKPHOS 129*  BILITOT 1.3*  PROT 8.6*  ALBUMIN 4.6   Recent Labs  Lab 09/30/22 1223  LIPASE 2,156*   No results for input(s): "AMMONIA" in the last 168 hours. CBC: Recent Labs  Lab 09/30/22 1223  WBC 7.8  HGB 13.3  HCT 40.5  MCV 97.6  PLT 317   Cardiac Enzymes: No results for input(s): "CKTOTAL", "CKMB", "CKMBINDEX", "TROPONINI" in the last 168 hours.  BNP (last 3 results) No results for input(s): "BNP" in the last 8760 hours.  ProBNP (last 3 results) No results for input(s): "PROBNP" in the last 8760 hours.  CBG: No results for input(s): "GLUCAP" in the last 168 hours.  Radiological Exams on Admission: CT Abdomen Pelvis W Contrast  Result Date: 09/30/2022 CLINICAL DATA:  Abdominal pain, acute, nonlocalized generalized pain. pancreatitis EXAM: CT ABDOMEN AND PELVIS WITH CONTRAST TECHNIQUE: Multidetector CT imaging of the abdomen and pelvis was performed using the standard protocol following bolus administration of intravenous contrast. RADIATION DOSE REDUCTION: This exam was performed according to the departmental dose-optimization program which includes automated exposure control, adjustment of the mA and/or kV according to patient size and/or use of iterative reconstruction technique. CONTRAST:  22mL OMNIPAQUE IOHEXOL 300 MG/ML  SOLN COMPARISON:  11/07/2021 FINDINGS: Lower chest: No acute abnormality Hepatobiliary: Diffuse low-density compatible with fatty infiltration. No focal hepatic abnormality. Gallbladder grossly unremarkable. There is intrahepatic and extrahepatic biliary ductal dilatation, likely related to pancreatic head cystic mass. Pancreas: 4.9 x 3.0 x 2.8 cm cystic mass within the pancreatic head. Associated pancreatic ductal dilatation measuring up to 11 mm and biliary ductal dilatation. Mild stranding adjacent to the pancreas. Overall, the cystic mass has decreased in size since prior study and the ductal  dilatation is similar to prior study. 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 Spleen: No focal abnormality.  Normal size. Adrenals/Urinary Tract: No adrenal abnormality. No focal renal abnormality. No stones or hydronephrosis. Urinary bladder is unremarkable. Stomach/Bowel: Normal appendix. Scattered colonic diverticulosis. No active diverticulitis. Stomach and small bowel decompressed, unremarkable. Vascular/Lymphatic: Aortic atherosclerosis. No evidence of aneurysm or adenopathy. Reproductive: No visible focal abnormality. Other: No free fluid or free air. Musculoskeletal: No acute bony abnormality or suspicious focal bone lesion. IMPRESSION: Continued large cystic area within the pancreatic head, but decreasing in size overall since prior study. Mild stranding around the pancreas. Associated pancreatic ductal and biliary ductal dilatation which is stable since prior study. Given the improvement since previous study, I favor this is related to pancreatitis with pseudocyst in the pancreatic head. Continued follow-up to resolution recommended. Colonic diverticulosis. Fatty liver. Aortic atherosclerosis. Electronically Signed   By: Rolm Baptise M.D.   On: 09/30/2022 14:42    EKG: I independently viewed the EKG done and my findings are as followed: None available at the time of this visit.  Assessment/Plan Present on Admission:  Acute pancreatitis  Principal Problem:   Acute pancreatitis  Acute alcoholic pancreatitis Chronic alcohol abuse No reported choledocholithiasis on imaging Last triglyceride level 48 on 12/28/2021. Supportive care IV fluid hydration IV opiate-based analgesics N.p.o. until the patient is ready to have a diet  Alcohol use disorder with concern for alcohol withdrawal Continue CIWA protocol Multivitamin, folic acid supplement, thiamine supplement. TOC consulted to assist with providing resources for alcohol abstinence  High anion  gap metabolic acidosis Serum bicarb 19, anion gap 18 Continue IV fluid hydration. Repeat chemistry panel in the morning  AKI, likely prerenal in the setting of dehydration from poor oral intake Baseline creatinine 1.0 with GFR greater than 60 Presented with creatinine 1.30. Continue IV fluid hydration Monitor urine output Repeat renal function panel in the morning  Elevated liver chemistries in the setting of chronic alcohol abuse Alkaline phosphatase, AST, ALT and T. bili  elevated Trend. Avoid hepatotoxic agents.  Pityriasis versicolor Endorses itching and not be able to reach his back to apply the topical treatment. States he was prescribed a shampoo for his fungal skin infection. Ordered po fluconazole 300 mg once a week x 2 weeks     DVT prophylaxis: Subcu Lovenox daily  Code Status: Full code  Family Communication: None at bedside  Disposition Plan: Admitted to MedSurg unit  Consults called: None  Admission status: Inpatient status.   Status is: Inpatient The patient requires at least 2 midnights for further evaluation and treatment of present condition.   Darlin Drop MD [facility] Pager [phone]  If 7PM-7AM, please contact night-coverage [email or web address] Password [email or web address]  09/30/2022, 9:55 PM

## 2022-09-30 NOTE — ED Notes (Signed)
Patient transported to CT 

## 2022-09-30 NOTE — ED Notes (Signed)
[facility] called for transport. 

## 2022-09-30 NOTE — ED Notes (Signed)
Pt has a calm dispostion and appears to be relaxed  Vitals WDL

## 2022-09-30 NOTE — ED Triage Notes (Signed)
Patient here POV from Home.  Endorses ABD Pain to Mid/Lower ABD since Monday. Consistent since.   Sent by PCP for Assessment. No N/V/D. Drinks approximately 1 Pint of Brandy Daily.   NAD Noted during Triage. A&Ox4. GCS 15. Ambulatory.

## 2022-09-30 NOTE — Progress Notes (Signed)
Subjective:     Patient ID: Jeremy Ewing, male    DOB: [birth year], 61 y.o.   MRN: [id number]  Chief Complaint  Patient presents with   Follow-up   Abdominal Pain    On set Monday   Erectile Dysfunction    HPI  Abd pain since 10/23-middle of abd.  Has been eating yogurt.  When ate more, increased pain. No n/v/d/c.   Drinking a lot of water.  No urinary complaints. Wants pain meds.  Binging some on ETOH.  Pint daily. Not this wk.   Tremors if no ETOH. ED-tried meds in past once-viagra-can't recall if worked.  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 (Patient not taking: Reported on 06/11/2022)     sildenafil (VIAGRA) 50 MG tablet 1 tablet as needed 30 min to 4 hours prior to sexual activity (Patient not taking: Reported on 06/11/2022)     No facility-administered medications prior to visit.    No Known Allergies ROS neg/noncontributory except as noted HPI/below      Objective:     BP (!) 128/94   Pulse (!) 111   Temp 97.9 F (36.6 C) (Oral)   Wt 176 lb 9.6 oz (80.1 kg)   SpO2 98%   BMI 23.95 kg/m  Wt Readings from Last 3 Encounters:  09/30/22 176 lb 9.6 oz (80.1 kg)  06/11/22 178 lb 8 oz (81 kg)  01/25/22 174 lb 8 oz (79.2 kg)    Physical Exam   Gen: WDWN NAD. AAM-mild tremors.  Smells like ketones HEENT: NCAT, conjunctiva not injected, sclera  nonicteric NECK:  supple, no thyromegaly, no nodes, no carotid bruits CARDIAC:tachy RRR, S1S2+, no murmur. LUNGS: CTAB. No wheezes ABDOMEN:  BS+, soft, NTND,+ liver felt, no masses EXT:  no edema MSK: no gross abnormalities.  NEURO: A&O x3.  CN II-XII intact.  PSYCH: normal mood. Good eye contact Flakey, flat rash all over trunk-annual spots     Assessment & Plan:   Problem List Items Addressed This Visit       Digestive   Pancreatitis   Other Visit Diagnoses     Generalized abdominal pain    -  Primary   Tremor       Elevated blood pressure reading       Rash          Abd pain-not to palp now.  Not eating  smells ketotic.  May be dehydrated-to ER Tremor-?DT's,  also, drinks to control tremors.  Will address later Elevated bp-?ETOH withdrawal, HTN.  Sending pt to ER for above so will address at f/u Rash- tinea versicolor. ketoconazole shampoo ED-viagra 50mg . H/O ETOH pancreatitis-drinking more ETOH  c/o abd pain-to ER.   No orders of the defined types were placed in this  encounter.   Wellington Hampshire, MD

## 2022-10-01 DIAGNOSIS — E871 Hypo-osmolality and hyponatremia: Secondary | ICD-10-CM

## 2022-10-01 DIAGNOSIS — N179 Acute kidney failure, unspecified: Secondary | ICD-10-CM

## 2022-10-01 DIAGNOSIS — F1010 Alcohol abuse, uncomplicated: Secondary | ICD-10-CM

## 2022-10-01 DIAGNOSIS — E872 Acidosis, unspecified: Secondary | ICD-10-CM

## 2022-10-01 DIAGNOSIS — K852 Alcohol induced acute pancreatitis without necrosis or infection: Secondary | ICD-10-CM | POA: Diagnosis not present

## 2022-10-01 LAB — CBC WITH DIFFERENTIAL/PLATELET
Abs Immature Granulocytes: 0.04 10*3/uL (ref 0.00–0.07)
Basophils Absolute: 0 10*3/uL (ref 0.0–0.1)
Basophils Relative: 1 %
Eosinophils Absolute: 0.2 10*3/uL (ref 0.0–0.5)
Eosinophils Relative: 2 %
HCT: 35.4 % — ABNORMAL LOW (ref 39.0–52.0)
Hemoglobin: 11.6 g/dL — ABNORMAL LOW (ref 13.0–17.0)
Immature Granulocytes: 1 %
Lymphocytes Relative: 13 %
Lymphs Abs: 1 10*3/uL (ref 0.7–4.0)
MCH: 32 pg (ref 26.0–34.0)
MCHC: 32.8 g/dL (ref 30.0–36.0)
MCV: 97.5 fL (ref 80.0–100.0)
Monocytes Absolute: 1.3 10*3/uL — ABNORMAL HIGH (ref 0.1–1.0)
Monocytes Relative: 16 %
Neutro Abs: 5.3 10*3/uL (ref 1.7–7.7)
Neutrophils Relative %: 67 %
Platelets: 316 10*3/uL (ref 150–400)
RBC: 3.63 MIL/uL — ABNORMAL LOW (ref 4.22–5.81)
RDW: 14.6 % (ref 11.5–15.5)
WBC: 7.8 10*3/uL (ref 4.0–10.5)
nRBC: 0 % (ref 0.0–0.2)

## 2022-10-01 LAB — LIPASE, BLOOD: Lipase: 1831 U/L — ABNORMAL HIGH (ref 11–51)

## 2022-10-01 LAB — COMPREHENSIVE METABOLIC PANEL
ALT: 53 U/L — ABNORMAL HIGH (ref 0–44)
AST: 105 U/L — ABNORMAL HIGH (ref 15–41)
Albumin: 3.3 g/dL — ABNORMAL LOW (ref 3.5–5.0)
Alkaline Phosphatase: 121 U/L (ref 38–126)
Anion gap: 14 (ref 5–15)
BUN: 10 mg/dL (ref 8–23)
CO2: 19 mmol/L — ABNORMAL LOW (ref 22–32)
Calcium: 8.8 mg/dL — ABNORMAL LOW (ref 8.9–10.3)
Chloride: 102 mmol/L (ref 98–111)
Creatinine, Ser: 0.94 mg/dL (ref 0.61–1.24)
GFR, Estimated: >60 mL/min (ref >60)
Glucose, Bld: 79 mg/dL (ref 70–99)
Potassium: 3.4 mmol/L — ABNORMAL LOW (ref 3.5–5.1)
Sodium: 135 mmol/L (ref 135–145)
Total Bilirubin: 1.8 mg/dL — ABNORMAL HIGH (ref 0.3–1.2)
Total Protein: 6.7 g/dL (ref 6.5–8.1)

## 2022-10-01 LAB — MAGNESIUM: Magnesium: 1.9 mg/dL (ref 1.7–2.4)

## 2022-10-01 LAB — PHOSPHORUS: Phosphorus: 2.9 mg/dL (ref 2.5–4.6)

## 2022-10-01 MED ORDER — IBUPROFEN 400 MG PO TABS
400.0000 mg | ORAL_TABLET | Freq: Four times a day (QID) | ORAL | Status: DC | PRN
  Administered 2022-10-02 – 2022-10-09 (×14): 400 mg via ORAL
  Filled 2022-10-01 (×16): qty 1

## 2022-10-01 MED ORDER — FOLIC ACID 1 MG PO TABS
1.0000 mg | ORAL_TABLET | Freq: Every day | ORAL | Status: DC
  Administered 2022-10-01 – 2022-10-10 (×10): 1 mg via ORAL
  Filled 2022-10-01 (×10): qty 1

## 2022-10-01 MED ORDER — ACETAMINOPHEN 325 MG PO TABS
650.0000 mg | ORAL_TABLET | Freq: Four times a day (QID) | ORAL | Status: DC | PRN
  Administered 2022-10-04 – 2022-10-09 (×12): 650 mg via ORAL
  Filled 2022-10-01 (×12): qty 2

## 2022-10-01 MED ORDER — IBUPROFEN 400 MG PO TABS: 400.0000 mg | ORAL_TABLET | Freq: Four times a day (QID) | ORAL | Status: DC | PRN

## 2022-10-01 MED ORDER — ENOXAPARIN SODIUM 40 MG/0.4ML IJ SOSY
40.0000 mg | PREFILLED_SYRINGE | INTRAMUSCULAR | Status: DC
  Administered 2022-10-01 – 2022-10-10 (×8): 40 mg via SUBCUTANEOUS
  Filled 2022-10-01 (×10): qty 0.4

## 2022-10-01 MED ORDER — POTASSIUM CHLORIDE CRYS ER 20 MEQ PO TBCR
40.0000 meq | EXTENDED_RELEASE_TABLET | ORAL | Status: AC
  Administered 2022-10-01 (×2): 40 meq via ORAL
  Filled 2022-10-01 (×2): qty 2

## 2022-10-01 MED ORDER — HYDROMORPHONE HCL 1 MG/ML IJ SOLN
0.5000 mg | Freq: Three times a day (TID) | INTRAMUSCULAR | Status: DC | PRN
  Administered 2022-10-01: 0.5 mg via INTRAVENOUS
  Filled 2022-10-01: qty 0.5

## 2022-10-01 MED ORDER — HYDROMORPHONE HCL 1 MG/ML IJ SOLN: 0.5000 mg | INTRAMUSCULAR | Status: DC

## 2022-10-01 NOTE — TOC Progression Note (Signed)
Transition of Care [facility]) - Progression Note    Patient Details  Name: Jeremy Ewing MRN: [id number] Date of Birth: 1961/09/12  Transition of Care [facility]) CM/SW [place], RN Phone Number:[phone]  10/01/2022, 12:03 PM  Clinical Narrative:     Transition of Care (TOC) Screening Note   Patient Details  Name: Jeremy Ewing Date of Birth: 1961/07/12   [facility]) CM/SW Contact:    Angelita Ingles, RN Phone Number: 10/01/2022, 12:03 PM    Transition of Care Department [facility]) has reviewed patient and no TOC needs have been identified at this time. We will continue to monitor patient advancement through interdisciplinary progression rounds. If new patient transition needs arise, please place a TOC consult.          Expected Discharge Plan and Services                                                 Social Determinants of Health (SDOH) Interventions    Readmission Risk Interventions     No data to display

## 2022-10-01 NOTE — Progress Notes (Signed)
[facility] Progress Note  Patient: Jeremy Ewing     ALP:[id number]  DOA: 09/30/2022   PCP: Jeani Sow, MD       Brief hospital course: This is a 61 year old with history of alcohol abuse who presents to the hospital for epigastric pain nausea, vomiting and poor oral intake.  In the ED, he was found to have a lipase of 2156, AST 83, ALT 56, T. bili 1.3 and alkaline phosphatase 129. Creatinine was found to be 1.3 and sodium 132. Bicarbonate noted to be 19 with an anion gap of 18. The patient was admitted for acute pancreatitis.    Subjective:  Abdominal pain has improved but he is having occasional back pain.  He no longer has nausea or vomiting.    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 -He drinks brandy and at least 3 beers a day - we have discussed ETOH cessation - cont CIWA scale -LFTs improving    AKI (acute kidney injury) (HCC) -Creatinine 1.3-improving to 0.94    Hyponatremia -Improving-continue IV fluids    Metabolic acidosis -Anion gap improved from 18-14-continue to follow  Hypokalemia - Replace and follow-magnesium and phosphorus levels are normal     Code Status: Full Code DVT prophylaxis:  enoxaparin (LOVENOX) injection 40 mg Start: 10/01/22 1000  Consultants: GI Level of Care: Level of care: Med-Surg  Objective:   Vitals:   10/01/22 0442 10/01/22 0731 10/01/22 0925 10/01/22 1329  BP: (!) 155/97 (!) 160/105 (!) 160/101 (!) 153/99  Pulse: (!) 101 95 87 95  Resp: 18 17 18 17   Temp: 99 F (37.2  C) 97.9 F (36.6 C) 98.1 F (36.7 C) 98.9 F (37.2 C)  TempSrc: Oral  Oral   SpO2: 100% 100% 100% 100%  Weight:      Height:       Filed Weights   09/30/22 1217  Weight: 80.1 kg   Exam: General exam: Appears comfortable  HEENT: oral mucosa moist Respiratory system: Clear to auscultation.  Cardiovascular system: S1 & S2 heard  Gastrointestinal system: Abdomen soft, mildly tender in epigastrium, nondistended. Normal bowel sounds   Extremities: No cyanosis, clubbing or edema Psychiatry:  Mood & affect appropriate.    Imaging and lab data was personally reviewed    CBC: Recent Labs  Lab 09/30/22 1223 10/01/22 0451  WBC 7.8 7.8  NEUTROABS  --  5.3  HGB 13.3 11.6*  HCT 40.5 35.4*  MCV 97.6 97.5  PLT 317 316   Basic Metabolic Panel: Recent Labs  Lab 09/30/22 1223 10/01/22 0451  NA 132* 135  K 4.3 3.4*  CL 95* 102  CO2 19* 19*  GLUCOSE 91 79  BUN 12 10  CREATININE 1.30* 0.94  CALCIUM 9.8 8.8*  MG  --  1.9  PHOS  --  2.9   GFR: Estimated Creatinine Clearance: 89.4 mL/min (by C-G formula based on SCr of 0.94 mg/dL).  Scheduled Meds:  enoxaparin (LOVENOX) injection  40 mg Subcutaneous Q24H   fluconazole  300 mg Oral Weekly    HYDROmorphone (DILAUDID) injection  0.5 mg Intravenous  Q1 Hr x 6   LORazepam  0-4 mg Intravenous Q6H   Or   LORazepam  0-4 mg Oral Q6H   [START ON 10/02/2022] LORazepam  0-4 mg Intravenous Q12H   Or   [START ON 10/02/2022] LORazepam  0-4 mg Oral Q12H   thiamine  100 mg Oral Daily   Or   thiamine  100 mg Intravenous Daily   Continuous Infusions:  lactated ringers 150 mL/hr at 10/01/22 1427     LOS: 1 day   Author: Debbe Odea  10/01/2022 3:15 PM

## 2022-10-01 NOTE — Consult Note (Signed)
Referring Provider: Dr. Nevada Crane Primary Care Physician:  Tawnya Crook, MD Primary Gastroenterologist:  Althia Forts  Reason for Consultation:  Pancreatitis  HPI: Jeremy Ewing is a 61 y.o. male with acute pancreatitis due to alcohol (reports 2 other episodes of pancreatitis within the past few years). Denies abdominal pain between these 3 episodes and states he was without abdominal pain until this week when he had severe epigastric pain with nausea without vomiting. Lipase 2156 yesterday and [id number] today. TB 1.3, ALP 129, AST 83, ALT 56. CT shows 4.9 X 3 X 2.8 cm cystic mass in pancreatic head with pancreatic and biliary ductal dilation. Size of mass has decreased since 11/2021. Drinks a 1/2 pint of liquor per day.  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 Authorizing Provider  acetaminophen (TYLENOL) 500 MG tablet    Yes [provider]  ketoconazole (NIZORAL) 2 % shampoo Apply 1 Application topically 2 (two) times a week. Patient not taking: Reported on 09/30/2022 09/30/22   Tawnya Crook, MD  sildenafil (VIAGRA) 50 MG tablet 1 tablet as needed 30 min to 4 hours prior to sexual activity Patient not taking: Reported on 09/30/2022 09/30/22   Tawnya Crook, MD    Scheduled Meds:  enoxaparin (LOVENOX) injection  40 mg Subcutaneous [id number]   folic acid  1 mg Oral Daily   LORazepam  0-4 mg Intravenous Q6H   Or   LORazepam  0-4 mg Oral Q6H   [START ON 10/02/2022] LORazepam  0-4 mg Intravenous Q12H   Or   [START ON 10/02/2022] LORazepam  0-4 mg Oral Q12H   thiamine  100 mg Oral Daily   Or   thiamine  100 mg  Intravenous Daily   Continuous Infusions:  lactated ringers 150 mL/hr at 10/01/22 1427   PRN Meds:.acetaminophen, HYDROmorphone (DILAUDID) injection, ibuprofen, melatonin, ondansetron (ZOFRAN) IV, polyethylene glycol  Allergies as of 09/30/2022   (No Known Allergies)    No family history on file.  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  Physical Exam: Vital signs: Vitals:   10/01/22 1329 10/01/22 1722  BP: (!) 153/99 (!) 164/108  Pulse: 95 91  Resp: 17 17  Temp: 98.9 F (37.2 C) 98.6 F (37 C)  SpO2: 100% 100%   Last BM Date : 09/29/22 General:   Lethargic, elderly, Well-developed, well-nourished, pleasant and cooperative in NAD Head: normocephalic, atraumatic Eyes: anicteric sclera ENT: oropharynx clear Neck: supple, nontender Lungs:   Clear throughout to auscultation.   No wheezes, crackles, or rhonchi. No acute distress. Heart:  Regular rate and rhythm; no murmurs, clicks, rubs,  or gallops. Abdomen: epigastric tenderness with guarding, soft, nondistended, +BS  Rectal:  Deferred Ext: no edema  GI:  Lab Results: Recent Labs    09/30/22 1223 10/01/22 0451  WBC 7.8 7.8  HGB 13.3 11.6*  HCT 40.5 35.4*  PLT 317 316   BMET Recent Labs    09/30/22 1223 10/01/22 0451  NA 132* 135  K 4.3 3.4*  CL 95* 102  CO2 19* 19*  GLUCOSE 91 79  BUN 12 10  CREATININE 1.30* 0.94  CALCIUM 9.8 8.8*   LFT Recent Labs    10/01/22 0451  PROT 6.7  ALBUMIN 3.3*  AST 105*  ALT 53*  ALKPHOS 121  BILITOT 1.8*   PT/INR No results for input(s): "LABPROT", "INR" in the last 72 hours.   Studies/Results: CT Abdomen Pelvis W Contrast  Result Date: 09/30/2022 CLINICAL DATA:  Abdominal pain, acute, nonlocalized generalized pain. pancreatitis EXAM: CT ABDOMEN AND PELVIS WITH CONTRAST TECHNIQUE: Multidetector CT imaging of the abdomen and pelvis was performed using the standard protocol following bolus administration of intravenous contrast. RADIATION DOSE REDUCTION: This exam was performed according to the departmental dose-optimization program which includes automated exposure control, adjustment of the mA and/or kV according to patient size and/or use of iterative reconstruction technique. CONTRAST:  52mL OMNIPAQUE IOHEXOL 300 MG/ML  SOLN COMPARISON:  11/07/2021 FINDINGS: Lower chest: No acute abnormality Hepatobiliary: Diffuse low-density compatible with fatty infiltration. No focal hepatic abnormality. Gallbladder grossly unremarkable. There is intrahepatic and extrahepatic biliary ductal dilatation, likely related to pancreatic head cystic mass. Pancreas: 4.9 x 3.0 x 2.8 cm cystic mass within the pancreatic head. Associated pancreatic ductal dilatation measuring up to 11 mm and biliary ductal dilatation. Mild stranding adjacent to  the pancreas. Overall, the cystic mass has decreased in size since prior study and the ductal dilatation is similar to prior study. 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 Spleen: No focal abnormality.  Normal size. Adrenals/Urinary Tract: No adrenal abnormality. No focal renal abnormality. No stones or hydronephrosis. Urinary bladder is unremarkable. Stomach/Bowel: Normal appendix. Scattered colonic diverticulosis. No active diverticulitis. Stomach and small bowel decompressed, unremarkable. Vascular/Lymphatic: Aortic atherosclerosis. No evidence of aneurysm or adenopathy. Reproductive: No visible focal abnormality. Other: No free fluid or free air. Musculoskeletal: No acute bony abnormality or suspicious focal bone lesion. IMPRESSION: Continued large cystic area within the pancreatic head, but decreasing in size overall since prior study. Mild stranding around the pancreas. Associated pancreatic ductal and biliary ductal dilatation which is stable since prior study. 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 Continued follow-up to resolution recommended. Colonic diverticulosis. Fatty liver. Aortic atherosclerosis. Electronically Signed   By: Rolm Baptise M.D.   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 Supportive care. Clear liquid diet. Our office will arrange outpt EUS for 8-10 weeks following discharge. Will sign off. Call if questions.    LOS: 1 day   [name]  10/01/2022, 6:12 PM  Questions please call [phone]

## 2022-10-02 ENCOUNTER — Inpatient Hospital Stay (HOSPITAL_COMMUNITY): Payer: Commercial Managed Care - HMO

## 2022-10-02 DIAGNOSIS — N179 Acute kidney failure, unspecified: Secondary | ICD-10-CM | POA: Diagnosis not present

## 2022-10-02 DIAGNOSIS — K852 Alcohol induced acute pancreatitis without necrosis or infection: Secondary | ICD-10-CM | POA: Diagnosis not present

## 2022-10-02 LAB — CBC
HCT: 34.9 % — ABNORMAL LOW (ref 39.0–52.0)
Hemoglobin: 11.6 g/dL — ABNORMAL LOW (ref 13.0–17.0)
MCH: 32.2 pg (ref 26.0–34.0)
MCHC: 33.2 g/dL (ref 30.0–36.0)
MCV: 96.9 fL (ref 80.0–100.0)
Platelets: 360 10*3/uL (ref 150–400)
RBC: 3.6 MIL/uL — ABNORMAL LOW (ref 4.22–5.81)
RDW: 14.2 % (ref 11.5–15.5)
WBC: 6.2 10*3/uL (ref 4.0–10.5)
nRBC: 0 % (ref 0.0–0.2)

## 2022-10-02 LAB — COMPREHENSIVE METABOLIC PANEL
ALT: 152 U/L — ABNORMAL HIGH (ref 0–44)
AST: 280 U/L — ABNORMAL HIGH (ref 15–41)
Albumin: 3.5 g/dL (ref 3.5–5.0)
Alkaline Phosphatase: 240 U/L — ABNORMAL HIGH (ref 38–126)
Anion gap: 8 (ref 5–15)
BUN: 5 mg/dL — ABNORMAL LOW (ref 8–23)
CO2: 27 mmol/L (ref 22–32)
Calcium: 9.3 mg/dL (ref 8.9–10.3)
Chloride: 102 mmol/L (ref 98–111)
Creatinine, Ser: 0.85 mg/dL (ref 0.61–1.24)
GFR, Estimated: >60 mL/min (ref >60)
Glucose, Bld: 119 mg/dL — ABNORMAL HIGH (ref 70–99)
Potassium: 3.5 mmol/L (ref 3.5–5.1)
Sodium: 137 mmol/L (ref 135–145)
Total Bilirubin: 3.3 mg/dL — ABNORMAL HIGH (ref 0.3–1.2)
Total Protein: 7 g/dL (ref 6.5–8.1)

## 2022-10-02 LAB — HIV ANTIBODY (ROUTINE TESTING W REFLEX): HIV Screen 4th Generation wRfx: NONREACTIVE

## 2022-10-02 NOTE — Progress Notes (Signed)
[facility] Progress Note  Patient: Jeremy Ewing     RFF:[id number]  DOA: 09/30/2022   PCP: Tawnya Crook, MD       Brief hospital course: This is a 61 year old with history of alcohol abuse who presents to the hospital for epigastric pain nausea, vomiting and poor oral intake.  In the ED, he was found to have a lipase of 2156, AST 83, ALT 56, T. bili 1.3 and alkaline phosphatase 129. Creatinine was found to be 1.3 and sodium 132. Bicarbonate noted to be 19 with an anion gap of 18. The patient was admitted for acute pancreatitis.    Subjective:  Has not had any significant abdominal pain. No nausea or vomiting. He continues to have symptoms of ETOH withdrawal, specifically tremors.   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 -He drinks brandy and at least 3 beers a day - we have discussed ETOH cessation - cont CIWA scale- he has been receiving PRN Ativan -LFTs rising today- obtain ultrasound liver/gallbladder    AKI (acute kidney injury) ([place]) -Creatinine 1.3-improving to 0.94    Hyponatremia -Improving     Metabolic acidosis -Anion gap improved from 18-14-continue to follow  Hypokalemia - Replace and follow-magnesium and phosphorus levels are normal     Code Status: Full Code DVT prophylaxis:  enoxaparin (LOVENOX) injection 40 mg Start: 10/01/22 1000  Consultants: GI Level of Care: Level of care: Med-Surg  Objective:   Vitals:   10/02/22 0125 10/02/22 0555  10/02/22 1012 10/02/22 1355  BP: (!) 164/111 (!) 166/111 (!) 160/101 (!) 152/102  Pulse: 85 82 88 (!) 109  Resp: 17 18 14 16   Temp: 98.7 F (37.1 C) 98.8 F (37.1 C) 99.4 F (37.4 C) 98.6 F (37 C)  TempSrc: Oral Oral Oral Oral  SpO2: 99% 100% 100% 100%  Weight:      Height:       Filed Weights   09/30/22 1217  Weight: 80.1 kg   Exam: General exam: Appears comfortable  HEENT: oral mucosa moist Respiratory system: Clear to auscultation.  Cardiovascular system: S1 & S2 heard  Gastrointestinal system: Abdomen soft, non-tender, nondistended. Normal bowel sounds   Extremities: No cyanosis, clubbing or edema Psychiatry:  Mood & affect appropriate.    Imaging and lab data was personally reviewed    CBC: Recent Labs  Lab 09/30/22 1223 10/01/22 0451 10/02/22 0454  WBC 7.8 7.8 6.2  NEUTROABS  --  5.3  --   HGB 13.3 11.6* 11.6*  HCT 40.5 35.4* 34.9*  MCV 97.6 97.5 96.9  PLT 317 316 357    Basic Metabolic Panel: Recent Labs  Lab 09/30/22 1223 10/01/22 0451 10/02/22 0454  NA 132* 135 137  K 4.3 3.4* 3.5  CL 95* 102 102  CO2 19* 19* 27  GLUCOSE 91 79 119*  BUN 12 10 5*  CREATININE 1.30* 0.94 0.85  CALCIUM 9.8 8.8* 9.3  MG  --  1.9  --   PHOS  --  2.9  --  GFR: Estimated Creatinine Clearance: 98.9 mL/min (by C-G formula based on SCr of 0.85 mg/dL).  Scheduled Meds:  enoxaparin (LOVENOX) injection  40 mg Subcutaneous Q24H   folic acid  1 mg Oral Daily   LORazepam  0-4 mg Intravenous Q6H   Or   LORazepam  0-4 mg Oral Q6H   LORazepam  0-4 mg Intravenous Q12H   Or   LORazepam  0-4 mg Oral Q12H   thiamine  100 mg Oral Daily   Or   thiamine  100 mg Intravenous Daily   Continuous Infusions:  lactated ringers 100 mL/hr at 10/02/22 1142     LOS: 2 days   Author: Ladell Heads Garold Sheeler  10/02/2022 1:59 PM

## 2022-10-03 ENCOUNTER — Inpatient Hospital Stay (HOSPITAL_COMMUNITY): Payer: Commercial Managed Care - HMO

## 2022-10-03 DIAGNOSIS — N179 Acute kidney failure, unspecified: Secondary | ICD-10-CM | POA: Diagnosis not present

## 2022-10-03 DIAGNOSIS — E860 Dehydration: Secondary | ICD-10-CM

## 2022-10-03 DIAGNOSIS — K852 Alcohol induced acute pancreatitis without necrosis or infection: Secondary | ICD-10-CM | POA: Diagnosis not present

## 2022-10-03 LAB — COMPREHENSIVE METABOLIC PANEL
ALT: 262 U/L — ABNORMAL HIGH (ref 0–44)
AST: 381 U/L — ABNORMAL HIGH (ref 15–41)
Albumin: 3.7 g/dL (ref 3.5–5.0)
Alkaline Phosphatase: 289 U/L — ABNORMAL HIGH (ref 38–126)
Anion gap: 8 (ref 5–15)
BUN: <5 mg/dL — ABNORMAL LOW (ref 8–23)
CO2: 28 mmol/L (ref 22–32)
Calcium: 9.6 mg/dL (ref 8.9–10.3)
Chloride: 100 mmol/L (ref 98–111)
Creatinine, Ser: 1.04 mg/dL (ref 0.61–1.24)
GFR, Estimated: >60 mL/min (ref >60)
Glucose, Bld: 110 mg/dL — ABNORMAL HIGH (ref 70–99)
Potassium: 3.6 mmol/L (ref 3.5–5.1)
Sodium: 136 mmol/L (ref 135–145)
Total Bilirubin: 5.4 mg/dL — ABNORMAL HIGH (ref 0.3–1.2)
Total Protein: 7.7 g/dL (ref 6.5–8.1)

## 2022-10-03 LAB — LIPASE, BLOOD: Lipase: 543 U/L — ABNORMAL HIGH (ref 11–51)

## 2022-10-03 MED ORDER — GADOBUTROL 1 MMOL/ML IV SOLN
8.0000 mL | Freq: Once | INTRAVENOUS | Status: AC | PRN
  Administered 2022-10-03: 8 mL via INTRAVENOUS

## 2022-10-03 MED ORDER — MORPHINE SULFATE (PF) 2 MG/ML IV SOLN
1.0000 mg | Freq: Once | INTRAVENOUS | Status: AC
  Administered 2022-10-03: 1 mg via INTRAVENOUS
  Filled 2022-10-03: qty 1

## 2022-10-03 NOTE — Progress Notes (Signed)
[facility] Progress Note  Patient: Jeremy Ewing     FBP:[id number]  DOA: 09/30/2022   PCP: Tawnya Crook, MD       Brief hospital course: This is a 61 year old with history of alcohol abuse who presents to the hospital for epigastric pain nausea, vomiting and poor oral intake.  In the ED, he was found to have a lipase of 2156, AST 83, ALT 56, T. bili 1.3 and alkaline phosphatase 129. Creatinine was found to be 1.3 and sodium 132. Bicarbonate noted to be 19 with an anion gap of 18. The patient was admitted for acute pancreatitis.    Subjective:  Has some on and off back pain. No abdominal pain, nausea or vomiting. Tolerating full liquids w/o symptoms. Tremors from ETOH withdrawal seems to be lessening.  Assessment and Plan: Principal Problem:   Acute pancreatitis -CT scan reveals large cystic area within the pancreatic head decreasing in size since the prior study with mild stranding around the pancreas, associated pancreatic ductal and biliary ductal dilatation which is stable since the prior study - Acute pancreatitis versus pseudocyst on CT scan- GI recommends an outpatient EUS after pancreatitis resolves and will arrange f/u in 8-10 wks -  advanced to full liquid which he is tolerating  Active Problems:   ETOH abuse  -He drinks brandy and at least 3 beers a day - we have discussed ETOH cessation - cont CIWA scale- he has been receiving PRN Ativan  Progressively rising LFTs -We obtained ultrasound liver/gallbladder yesterday which show dilated intra and extrahepatic bile ducts    AKI (acute kidney injury) ([place]) -Creatinine 1.3- Improved to 0.85 on 10/28    Hyponatremia - resolved     Metabolic acidosis -Anion gap of 18-14- has improved to normal  Hypokalemia - Replaced  -magnesium and phosphorus levels were normal  Normocytic anemia     Code Status: Full Code DVT prophylaxis:  enoxaparin (LOVENOX) injection 40 mg Start: 10/01/22 1000  Consultants:  GI Level of Care: Level of care: Med-Surg  Objective:   Vitals:   10/02/22 2022 10/03/22 0209 10/03/22 0445 10/03/22 0903  BP: (!) 162/104 (!) 165/105 (!) 157/109 (!) 145/104  Pulse: 91 92 99 (!) 108  Resp: 18 18 18 17   Temp: 99 F (37.2 C) 98.6 F (37 C) 98.6 F (37 C) 98.4 F (36.9 C)  TempSrc: Oral Oral Oral Oral  SpO2: 100% 100% 100% 100%  Weight:      Height:       Filed Weights   09/30/22 1217  Weight: 80.1 kg   Exam: General exam: Appears comfortable  HEENT: oral mucosa moist Respiratory system: Clear to auscultation.  Cardiovascular system: S1 & S2 heard  Gastrointestinal system: Abdomen soft, non-tender, nondistended. Normal bowel sounds   Extremities: No cyanosis, clubbing or edema Psychiatry:  Mood & affect appropriate.    Imaging and lab data was personally reviewed    CBC: Recent Labs  Lab 09/30/22 1223 10/01/22 0451 10/02/22 0454  WBC 7.8 7.8 6.2  NEUTROABS  --  5.3  --   HGB 13.3 11.6* 11.6*  HCT 40.5 35.4* 34.9*  MCV 97.6 97.5 96.9  PLT 317 316 824    Basic Metabolic Panel: Recent Labs  Lab 09/30/22 1223 10/01/22 0451 10/02/22 0454 10/03/22 0449  NA 132* 135 137 136  K 4.3 3.4* 3.5 3.6  CL 95* 102 102 100  CO2 19* 19* 27 28  GLUCOSE 91 79 119* 110*  BUN 12 10 5* <5*  CREATININE 1.30* 0.94 0.85 1.04  CALCIUM 9.8 8.8* 9.3 9.6  MG  --  1.9  --   --   PHOS  --  2.9  --   --     GFR: Estimated Creatinine Clearance: 80.8 mL/min (by C-G formula based on SCr of 1.04 mg/dL).  Scheduled Meds:  enoxaparin (LOVENOX) injection  40 mg Subcutaneous Q24H   folic acid  1 mg Oral Daily   LORazepam  0-4 mg Intravenous Q12H   Or   LORazepam  0-4 mg Oral Q12H   thiamine  100 mg Oral Daily   Or   thiamine  100 mg Intravenous Daily   Continuous Infusions:  lactated ringers 50 mL/hr at 10/03/22 0924     LOS: 3 days   Author: Ladell Heads Cristela Stalder  10/03/2022 1:19 PM

## 2022-10-03 NOTE — Progress Notes (Signed)
[facility] Gastroenterology Progress Note  Jeremy Ewing 61 y.o. [birth year]   Subjective: Complaining of back pain. Denies abdominal pain/N/V.  Objective: Vital signs: Vitals:   10/03/22 0445 10/03/22 0903  BP: (!) 157/109 (!) 145/104  Pulse: 99 (!) 108  Resp: 18 17  Temp: 98.6 F (37 C) 98.4 F (36.9 C)  SpO2: 100% 100%    Physical Exam: Gen: lethargic, no acute distress  HEENT: anicteric sclera CV: RRR Chest: CTA B Abd: nontender, nondistended, soft, +BS Ext: no edema  Lab Results: Recent Labs    10/01/22 0451 10/02/22 0454 10/03/22 0449  NA 135 137 136  K 3.4* 3.5 3.6  CL 102 102 100  CO2 19* 27 28  GLUCOSE 79 119* 110*  BUN 10 5* <5*  CREATININE 0.94 0.85 1.04  CALCIUM 8.8* 9.3 9.6  MG 1.9  --   --   PHOS 2.9  --   --    Recent Labs    10/02/22 0454 10/03/22 0449  AST 280* 381*  ALT 152* 262*  ALKPHOS 240* 289*  BILITOT 3.3* 5.4*  PROT 7.0 7.7  ALBUMIN 3.5 3.7   Recent Labs    10/01/22 0451 10/02/22 0454  WBC 7.8 6.2  NEUTROABS 5.3  --   HGB 11.6* 11.6*  HCT 35.4* 34.9*  MCV 97.5 96.9  PLT 316 360      Assessment/Plan: Acute pancreatitis with a cystic head mass and history of alcohol abuse - LFTs are rising concerning for biliary obstruction from the pancreatic mass. Needs an MRCP to evaluate for extrinsic compression of the common bile ducts and if that is seen then will need an ERCP and/or EUS this week. NPO until MRCP. Supportive care. [facility] will f/u.   Jeremy Ewing 10/03/2022, 11:03 AM  Questions please call [phone] Patient ID: Jeremy Ewing, male   DOB: [birth year], 61 y.o.   MRN: [id number]

## 2022-10-04 DIAGNOSIS — N179 Acute kidney failure, unspecified: Secondary | ICD-10-CM | POA: Diagnosis not present

## 2022-10-04 DIAGNOSIS — E860 Dehydration: Secondary | ICD-10-CM | POA: Diagnosis not present

## 2022-10-04 DIAGNOSIS — K852 Alcohol induced acute pancreatitis without necrosis or infection: Secondary | ICD-10-CM | POA: Diagnosis not present

## 2022-10-04 LAB — COMPREHENSIVE METABOLIC PANEL
ALT: 292 U/L — ABNORMAL HIGH (ref 0–44)
AST: 321 U/L — ABNORMAL HIGH (ref 15–41)
Albumin: 3.4 g/dL — ABNORMAL LOW (ref 3.5–5.0)
Alkaline Phosphatase: 265 U/L — ABNORMAL HIGH (ref 38–126)
Anion gap: 8 (ref 5–15)
BUN: <5 mg/dL — ABNORMAL LOW (ref 8–23)
CO2: 26 mmol/L (ref 22–32)
Calcium: 9 mg/dL (ref 8.9–10.3)
Chloride: 101 mmol/L (ref 98–111)
Creatinine, Ser: 0.78 mg/dL (ref 0.61–1.24)
GFR, Estimated: >60 mL/min (ref >60)
Glucose, Bld: 116 mg/dL — ABNORMAL HIGH (ref 70–99)
Potassium: 3.2 mmol/L — ABNORMAL LOW (ref 3.5–5.1)
Sodium: 135 mmol/L (ref 135–145)
Total Bilirubin: 5.7 mg/dL — ABNORMAL HIGH (ref 0.3–1.2)
Total Protein: 6.7 g/dL (ref 6.5–8.1)

## 2022-10-04 LAB — CBC
HCT: 35.8 % — ABNORMAL LOW (ref 39.0–52.0)
Hemoglobin: 11.9 g/dL — ABNORMAL LOW (ref 13.0–17.0)
MCH: 31.6 pg (ref 26.0–34.0)
MCHC: 33.2 g/dL (ref 30.0–36.0)
MCV: 95.2 fL (ref 80.0–100.0)
Platelets: 450 10*3/uL — ABNORMAL HIGH (ref 150–400)
RBC: 3.76 MIL/uL — ABNORMAL LOW (ref 4.22–5.81)
RDW: 14.5 % (ref 11.5–15.5)
WBC: 5.5 10*3/uL (ref 4.0–10.5)
nRBC: 0 % (ref 0.0–0.2)

## 2022-10-04 LAB — MAGNESIUM: Magnesium: 1.8 mg/dL (ref 1.7–2.4)

## 2022-10-04 MED ORDER — POTASSIUM CHLORIDE CRYS ER 20 MEQ PO TBCR
40.0000 meq | EXTENDED_RELEASE_TABLET | ORAL | Status: AC
  Administered 2022-10-04 (×2): 40 meq via ORAL
  Filled 2022-10-04 (×2): qty 2

## 2022-10-04 MED ORDER — POTASSIUM CHLORIDE CRYS ER 20 MEQ PO TBCR: 40.0000 meq | EXTENDED_RELEASE_TABLET | ORAL | Status: DC

## 2022-10-04 NOTE — Progress Notes (Signed)
[facility] Progress Note  Patient: Jeremy Ewing     CWC:[id number]  DOA: 09/30/2022   PCP: Jeani Sow, MD       Brief hospital course: This is a 61 year old with history of alcohol abuse who presents to the hospital for epigastric pain nausea, vomiting and poor oral intake.   In the ED, he was found to have a lipase of 2156, AST 83, ALT 56, T. bili 1.3 and alkaline phosphatase 129. Creatinine was found to be 1.3 and sodium 132. Bicarbonate noted to be 19 with an anion gap of 18. The patient was admitted for acute pancreatitis.   LFTs have been noted to be rising steadily which acute pancreatitis is resolving.   Subjective:  He has no new complaints  Assessment and Plan: Principal Problem:   Acute pancreatitis -CT scan reveals large cystic area within the pancreatic head decreasing in size since the prior study with mild stranding around the pancreas, associated pancreatic ductal and biliary ductal dilatation which is stable since the prior study - Acute pancreatitis versus pseudocyst on CT scan- GI recommends an outpatient EUS after pancreatitis resolves and will arrange f/u in 8-10 wks -  advanced to full liquid which he is tolerating  Active Problems:   ETOH abuse  -He drinks brandy and at least 3 beers a day - we have discussed ETOH cessation - cont CIWA scale- he has been receiving PRN Ativan  Progressively rising LFTs -10/28>  ultrasound liver/gallbladder which show dilated intra and extrahepatic bile ducts - MRCP reveals enlarging pancreatic fluid collection and dilated intra, extrahepatic bile ducts and severe pancreatic duct dilatation    AKI (acute kidney injury) ([facility]) -Creatinine 1.3- Improved to 0.85 on 10/28    Hyponatremia - resolved     Metabolic acidosis -Anion gap of 18-14- has improved to normal  Hypokalemia - Replacing  -magnesium and phosphorus levels were normal  Normocytic anemia     Code Status: Full Code DVT prophylaxis:   enoxaparin (LOVENOX) injection 40 mg Start: 10/01/22 1000  Consultants: GI Level of Care: Level of care: Med-Surg  Objective:   Vitals:   10/04/22 0134 10/04/22 0553 10/04/22 0930 10/04/22 1258  BP: (!) 171/108 (!) 152/109 (!) 137/103 (!) 146/102  Pulse: 64 97 (!) 102 (!) 101  Resp: 18 18 17 17   Temp: 98.6 F (37 C) 98.9 F (37.2 C) 98.6 F (37 C) 99 F (37.2 C)  TempSrc:  Oral Oral Oral  SpO2: 100% 100% 100% 100%  Weight:      Height:       Filed Weights   09/30/22 1217  Weight: 80.1 kg   Exam: General exam: Appears comfortable  HEENT: oral mucosa moist Respiratory system: Clear to auscultation.  Cardiovascular system: S1 & S2 heard  Gastrointestinal system: Abdomen soft, non-tender, nondistended. Normal bowel sounds   Extremities: No cyanosis, clubbing or edema Psychiatry:  Mood & affect appropriate.    Imaging and lab data was personally reviewed    CBC: Recent Labs  Lab 09/30/22 1223 10/01/22 0451 10/02/22 0454 10/04/22 0425  WBC 7.8 7.8 6.2 5.5  NEUTROABS  --  5.3  --   --   HGB 13.3 11.6* 11.6* 11.9*  HCT 40.5 35.4* 34.9* 35.8*  MCV 97.6 97.5 96.9 95.2  PLT 317 316 360 450*    Basic Metabolic Panel: Recent Labs  Lab 09/30/22 1223 10/01/22 0451 10/02/22 0454 10/03/22 0449 10/04/22 0425  NA 132* 135 137 136 135  K 4.3 3.4* 3.5 3.6  3.2*  CL 95* 102 102 100 101  CO2 19* 19* 27 28 26   GLUCOSE 91 79 119* 110* 116*  BUN 12 10 5* <5* <5*  CREATININE 1.30* 0.94 0.85 1.04 0.78  CALCIUM 9.8 8.8* 9.3 9.6 9.0  MG  --  1.9  --   --  1.8  PHOS  --  2.9  --   --   --     GFR: Estimated Creatinine Clearance: 105.1 mL/min (by C-G formula based on SCr of 0.78 mg/dL).  Scheduled Meds:  enoxaparin (LOVENOX) injection  40 mg Subcutaneous Y18H   folic acid  1 mg Oral Daily   thiamine  100 mg Oral Daily   Or   thiamine  100 mg Intravenous Daily   Continuous Infusions:     LOS: 4 days   Author: Debbe Odea  10/04/2022 2:53 PM

## 2022-10-04 NOTE — Progress Notes (Signed)
Mobility Specialist - Progress Note   10/04/22 1138  Mobility  Activity Ambulated independently in hallway  Level of Assistance Independent  Assistive Device None  Distance Ambulated (ft) 600 ft  Activity Response Tolerated well  Mobility Referral Yes  $Mobility charge 1 Mobility   Pt received in recliner and agreeable to mobility. No complaints during mobility. Pt to recliner after session with all needs met.    [facility]

## 2022-10-05 DIAGNOSIS — E860 Dehydration: Secondary | ICD-10-CM | POA: Diagnosis not present

## 2022-10-05 DIAGNOSIS — N179 Acute kidney failure, unspecified: Secondary | ICD-10-CM | POA: Diagnosis not present

## 2022-10-05 DIAGNOSIS — K852 Alcohol induced acute pancreatitis without necrosis or infection: Secondary | ICD-10-CM | POA: Diagnosis not present

## 2022-10-05 DIAGNOSIS — R7989 Other specified abnormal findings of blood chemistry: Secondary | ICD-10-CM

## 2022-10-05 LAB — COMPREHENSIVE METABOLIC PANEL
ALT: 324 U/L — ABNORMAL HIGH (ref 0–44)
AST: 330 U/L — ABNORMAL HIGH (ref 15–41)
Albumin: 3.5 g/dL (ref 3.5–5.0)
Alkaline Phosphatase: 273 U/L — ABNORMAL HIGH (ref 38–126)
Anion gap: 9 (ref 5–15)
BUN: 5 mg/dL — ABNORMAL LOW (ref 8–23)
CO2: 23 mmol/L (ref 22–32)
Calcium: 9 mg/dL (ref 8.9–10.3)
Chloride: 103 mmol/L (ref 98–111)
Creatinine, Ser: 0.83 mg/dL (ref 0.61–1.24)
GFR, Estimated: >60 mL/min (ref >60)
Glucose, Bld: 121 mg/dL — ABNORMAL HIGH (ref 70–99)
Potassium: 4.4 mmol/L (ref 3.5–5.1)
Sodium: 135 mmol/L (ref 135–145)
Total Bilirubin: 8 mg/dL — ABNORMAL HIGH (ref 0.3–1.2)
Total Protein: 7 g/dL (ref 6.5–8.1)

## 2022-10-05 MED ORDER — FLUCONAZOLE 100 MG PO TABS
300.0000 mg | ORAL_TABLET | ORAL | Status: DC
  Administered 2022-10-07: 300 mg via ORAL
  Filled 2022-10-05 (×2): qty 3

## 2022-10-05 MED ORDER — AMLODIPINE BESYLATE 5 MG PO TABS
5.0000 mg | ORAL_TABLET | Freq: Every day | ORAL | Status: DC
  Administered 2022-10-05 – 2022-10-10 (×5): 5 mg via ORAL
  Filled 2022-10-05 (×6): qty 1

## 2022-10-05 MED ORDER — AMLODIPINE BESYLATE 5 MG PO TABS: 5.0000 mg | ORAL_TABLET | Freq: Every day | ORAL | Status: DC

## 2022-10-05 NOTE — Progress Notes (Addendum)
[facility] Progress Note  Patient: Jeremy Ewing     XNA:[id number]  DOA: 09/30/2022   PCP: Tawnya Crook, MD       Brief hospital course: This is a 61 year old with history of alcohol abuse who presents to the hospital for epigastric pain nausea, vomiting and poor oral intake.   In the ED, he was found to have a lipase of 2156, AST 83, ALT 56, T. bili 1.3 and alkaline phosphatase 129. Creatinine was found to be 1.3 and sodium 132. Bicarbonate noted to be 19 with an anion gap of 18. The patient was admitted for acute pancreatitis.   LFTs have been noted to be rising steadily which acute pancreatitis is resolving.   Subjective:  He has no complaints. We have discussed his HTN and he is agreeable to start medication for it.   Assessment and Plan: Principal Problem:   Acute pancreatitis -CT scan reveals large cystic area within the pancreatic head decreasing in size since the prior study with mild stranding around the pancreas, associated pancreatic ductal and biliary ductal dilatation which is stable since the prior study - now tolerating solid food  Active Problems:   ETOH abuse  -He drinks brandy daily and at least 3 beers a day - he had mild ETOH withdrawal  - we have discussed ETOH cessation and he is agreeable to this  Progressively rising LFTs -10/28>  ultrasound liver/gallbladder which show dilated intra and extrahepatic bile ducts - MRCP reveals enlarging pancreatic fluid collection and dilated intra, extrahepatic bile ducts and severe pancreatic duct dilatation - GI  following and will consider ERCP on Friday if needed - will need EUS as outpt for pancreatic head mass  Tenia Versicolor  - Diflucan weekly- next dose on Thursday    AKI (acute kidney injury) ([place]) -Creatinine 1.3- Improved to 0.85 on 10/28    Hyponatremia - resolved     Metabolic acidosis -Anion gap of 18-14- has improved to normal  Hypokalemia - Replaced -magnesium and phosphorus  levels were normal  HTN - per patient, he does not have HTN routinely - start Norvasc 5 mg with holding parameters today  Normocytic anemia     Code Status: Full Code DVT prophylaxis:  enoxaparin (LOVENOX) injection 40 mg Start: 10/01/22 1000  Consultants: GI Level of Care: Level of care: Med-Surg  Objective:   Vitals:   10/04/22 2011 10/05/22 0604 10/05/22 1318 10/05/22 1536  BP: 139/84 (!) 164/97 (!) 166/112 (!) 168/110  Pulse: 92 95 91   Resp: 18 18 18    Temp: 99 F (37.2 C) 98.4 F (36.9 C) 99.7 F (37.6 C)   TempSrc: Oral Oral Oral   SpO2: 98% 100% 100%   Weight:      Height:       Filed Weights   09/30/22 1217  Weight: 80.1 kg   Exam: General exam: Appears comfortable  HEENT: oral mucosa moist Respiratory system: Clear to auscultation.  Cardiovascular system: S1 & S2 heard  Gastrointestinal system: Abdomen soft, non-tender, nondistended. Normal bowel sounds   Extremities: No cyanosis, clubbing or edema Psychiatry:  Mood & affect appropriate.     Imaging and lab data was personally reviewed    CBC: Recent Labs  Lab 09/30/22 1223 10/01/22 0451 10/02/22 0454 10/04/22 0425  WBC 7.8 7.8 6.2 5.5  NEUTROABS  --  5.3  --   --   HGB 13.3 11.6* 11.6* 11.9*  HCT 40.5 35.4* 34.9* 35.8*  MCV 97.6 97.5 96.9 95.2  PLT  317 316 360 450*    Basic Metabolic Panel: Recent Labs  Lab 10/01/22 0451 10/02/22 0454 10/03/22 0449 10/04/22 0425 10/05/22 0443  NA 135 137 136 135 135  K 3.4* 3.5 3.6 3.2* 4.4  CL 102 102 100 101 103  CO2 19* 27 28 26 23   GLUCOSE 79 119* 110* 116* 121*  BUN 10 5* <5* <5* 5*  CREATININE 0.94 0.85 1.04 0.78 0.83  CALCIUM 8.8* 9.3 9.6 9.0 9.0  MG 1.9  --   --  1.8  --   PHOS 2.9  --   --   --   --     GFR: Estimated Creatinine Clearance: 101.3 mL/min (by C-G formula based on SCr of 0.83 mg/dL).  Scheduled Meds:  amLODipine  5 mg Oral Daily   enoxaparin (LOVENOX) injection  40 mg Subcutaneous Q24H   folic acid  1 mg Oral Daily    thiamine  100 mg Oral Daily   Or   thiamine  100 mg Intravenous Daily   Continuous Infusions:     LOS: 5 days   Author:  10/05/2022 3:50 PM

## 2022-10-05 NOTE — Progress Notes (Signed)
[facility] Gastroenterology Progress Note  Jeremy Ewing 61 y.o. Jun 20, 1961   Subjective: Patient seen and examined lying in bed.  Notes abdominal pain is much improved.  He is able to tolerate regular diet.  He wishes to go home.    Objective: Vital signs in last 24 hours: Vitals:   10/05/22 1318 10/05/22 1536  BP: (!) 166/112 (!) 168/110  Pulse: 91   Resp: 18   Temp: 99.7 F (37.6 C)   SpO2: 100%     Physical Exam:  General:  Alert, cooperative, no distress, appears stated age  Head:  Normocephalic, without obvious abnormality, atraumatic  Eyes:  icteric sclera, EOM's intact  Lungs:   Clear to auscultation bilaterally, respirations unlabored  Heart:  Regular rate and rhythm, S1, S2 normal  Abdomen:   Soft, non-tender, bowel sounds active all four quadrants,  no masses,   Extremities: Extremities normal, atraumatic, no  edema    Lab Results: Recent Labs    10/04/22 0425 10/05/22 0443  NA 135 135  K 3.2* 4.4  CL 101 103  CO2 26 23  GLUCOSE 116* 121*  BUN <5* 5*  CREATININE 0.78 0.83  CALCIUM 9.0 9.0  MG 1.8  --    Recent Labs    10/04/22 0425 10/05/22 0443  AST 321* 330*  ALT 292* 324*  ALKPHOS 265* 273*  BILITOT 5.7* 8.0*  PROT 6.7 7.0  ALBUMIN 3.4* 3.5   Recent Labs    10/04/22 0425  WBC 5.5  HGB 11.9*  HCT 35.8*  MCV 95.2  PLT 450*   No results for input(s): "LABPROT", "INR" in the last 72 hours.    Assessment Acute pancreatitis due to alcohol use This cystic head mass of the pancreas Elevated LFTs Hyperbilirubinemia  Patient with improving abdominal pain.  Lab work notable for worsening LFTs as well as increasing hyperbilirubinemia.  HGB 11.9 Platelets 450 AST 330(321) ALT 324(292)  Alkphos 419(379) TBili 8.0(5.7)  MRCP 10/03/2022 1. Interval enlargement of a rim enhancing fluid collection in the central pancreatic head measuring 6.3 x 3.7 x 3.3 cm, previously 4.9 x 3.1 x 2.8 cm. Findings are most consistent with an enlarging  acute pancreatic fluid collection. Presence or absence of infection within this fluid is not established by CT. 2. Severe intra and extrahepatic biliary ductal dilatation, as well as severe pancreatic ductal dilatation, the ducts effaced centrally in the pancreatic head. 3. 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  Case discussed with Dr. Therisa Doyne with [facility] and Dr. Rush Landmark of [facility].  With patient CT scan will likely need EUS for evaluation of central pancreatic head cyst.  If no improvement in hyperbilirubinemia over the next couple days patient may also benefit from ERCP with stenting to help improve biliary flow.   Plan: Continue to monitor liver enzymes and bilirubin for improvement.  If continuing to worsen consider ERCP with possible stenting on Friday afternoon.  Patient will need EUS for further evaluation of pancreatic head cystic mass.  Can do as outpatient. Continue regular diet Continue pain control and antiemetics as needed Continue MiraLAX 17 g daily as needed [facility] will follow.  Arvella Nigh Bruce Churilla PA-C 10/05/2022, 3:38 PM  Contact #  [phone]

## 2022-10-05 NOTE — Progress Notes (Signed)
Dr Wynelle Cleveland notified of BP 166/112. Will cont to monitor

## 2022-10-06 DIAGNOSIS — K852 Alcohol induced acute pancreatitis without necrosis or infection: Secondary | ICD-10-CM | POA: Diagnosis not present

## 2022-10-06 LAB — COMPREHENSIVE METABOLIC PANEL
ALT: 375 U/L — ABNORMAL HIGH (ref 0–44)
AST: 332 U/L — ABNORMAL HIGH (ref 15–41)
Albumin: 3.6 g/dL (ref 3.5–5.0)
Alkaline Phosphatase: 275 U/L — ABNORMAL HIGH (ref 38–126)
Anion gap: 10 (ref 5–15)
BUN: 6 mg/dL — ABNORMAL LOW (ref 8–23)
CO2: 24 mmol/L (ref 22–32)
Calcium: 9.2 mg/dL (ref 8.9–10.3)
Chloride: 103 mmol/L (ref 98–111)
Creatinine, Ser: 0.86 mg/dL (ref 0.61–1.24)
GFR, Estimated: >60 mL/min (ref >60)
Glucose, Bld: 108 mg/dL — ABNORMAL HIGH (ref 70–99)
Potassium: 4 mmol/L (ref 3.5–5.1)
Sodium: 137 mmol/L (ref 135–145)
Total Bilirubin: 8.6 mg/dL — ABNORMAL HIGH (ref 0.3–1.2)
Total Protein: 7.3 g/dL (ref 6.5–8.1)

## 2022-10-06 MED ORDER — ALUM & MAG HYDROXIDE-SIMETH 200-200-20 MG/5ML PO SUSP
30.0000 mL | Freq: Four times a day (QID) | ORAL | Status: DC | PRN
  Administered 2022-10-06: 30 mL via ORAL
  Filled 2022-10-06: qty 30

## 2022-10-06 MED ORDER — PANTOPRAZOLE SODIUM 40 MG PO TBEC
40.0000 mg | DELAYED_RELEASE_TABLET | Freq: Every day | ORAL | Status: DC
  Administered 2022-10-06 – 2022-10-10 (×5): 40 mg via ORAL
  Filled 2022-10-06 (×5): qty 1

## 2022-10-06 NOTE — Hospital Course (Addendum)
61 year old male with history of alcohol abuse, hypertension, anemia admitted with working diagnosis of acute interstitial edematous pancreatitis/pancreatic head pseudocyst/AKI/metabolic acidosis liver dysfunction He presented to ED 10/26 with epigastric pain and nausea, poor oral intake. In the ED lipase was more than 2000 CT abd/pelvis-continued large cystic area within the pancreatic head but decreasing in size , pancreatic ductal and biliary ductal dilation-stable since previous study 11/07/2021, mild stranding around the pancreas. Patient admitted GI consulted being managed conservatively His bilirubin continues to rise up-concerned about pancreatic head pseudocyst having some mass effect and obstruction Underwent ERCP with plastic stent placement 11/4, repeat LFTs 11/5 nicely improving, patient tolerating diet. Patient will be discharged once okay with GI- per he will need repeat imaging CT abdomen and pelvis with contrast in 2-3 weeks for evaluation of pancreatic cyst and then EGD for stent removal thereafteter"

## 2022-10-06 NOTE — Progress Notes (Signed)
PROGRESS NOTE Jeremy Ewing  VEH:[id number] DOB: [birth year] DOA: 09/30/2022 PCP: Tawnya Crook, MD   Brief Narrative/Hospital Course: 60 year old male with history of alcohol abuse, hypertension, anemia admitted with working diagnosis of acute interstitial edematous pancreatitis/pancreatic head pseudocyst/AKI/metabolic acidosis liver dysfunction  He presented to ED 10/26 with epigastric pain and nausea, poor oral intake. In the ED lipase was more than 2000 CT abd/pelvis-continued large cystic area within the pancreatic head but decreasing in size , pancreatic ductal and biliary ductal dilation-stable since previous study 11/07/2021, mild stranding around the pancreas. Patient admitted GI consulted being managed conservatively   Subjective: Seen and examined this morning.  Ambulating.  Tolerating diet no nausea vomiting or abdominal pain. Overnight Tmax 99.7 Labs showed AST/ALT /ALP/TB: 332/375/275/8.6-overall unchanged W/ increasing T. Bili Electrolytes and renal function stable   Assessment and Plan: Principal Problem:   Acute pancreatitis Active Problems:   ETOH abuse   AKI (acute kidney injury) ([place])   Hyponatremia   Metabolic acidosis   Dehydration   Elevated LFTs   Pancreatic head pseudocyst Acute interstitial edematous pancreatitis pancreatitis Intra and extrahepatic biliary dilatation-pending attic duct dilatation Hepatic steatosis: CT finding as above. Tolerating solid food, being managed conservatively GI following> liver tests continues to worsen if trend does not improve plan for ERCP 10/08/2022 continue pain management, appreciate [facility] input.  Progressively rising LFTs/abnormal LFTs: See above plan for ERCP if continues to rise  Recent Labs  Lab 10/02/22 0454 10/03/22 0449 10/04/22 0425 10/05/22 0443 10/06/22 0418  AST 280* 381* 321* 330* 332*  ALT 152* 262* 292* 324* 375*  ALKPHOS 240* 289* 265* 273* 275*  BILITOT 3.3* 5.4* 5.7* 8.0* 8.6*  PROT 7.0  7.7 6.7 7.0 7.3  ALBUMIN 3.5 3.7 3.4* 3.5 3.6    AKI Hyponatremia Metabolic acidosis  Hypokalemia: Renal function is stable sodium potassium magnesium stable bicarb stable Recent Labs  Lab 10/01/22 0451 10/02/22 0454 10/03/22 0449 10/04/22 0425 10/05/22 0443 10/06/22 0418  NA 135 137 136 135 135 137  K 3.4* 3.5 3.6 3.2* 4.4 4.0  CL 102 102 100 101 103 103  CO2 19* 27 28 26 23 24   GLUCOSE 79 119* 110* 116* 121* 108*  BUN 10 5* <5* <5* 5* 6*  CREATININE 0.94 0.85 1.04 0.78 0.83 0.86  CALCIUM 8.8* 9.3 9.6 9.0 9.0 9.2  MG 1.9  --   --  1.8  --   --   PHOS 2.9  --   --   --   --   --      Hypertension: BP well controlled amlodipine started on 10/31 Normocytic anemia-hemoglobin stable in 11gms.   DVT prophylaxis: enoxaparin (LOVENOX) injection 40 mg Start: 10/01/22 1000 Code Status:   Code Status: Full Code Family Communication: plan of care discussed with patient at bedside. Patient status is: Inpatient because of liver dysfunction Level of care: Med-Surg   Dispo: The patient is from: Home            Anticipated disposition: TBD.  Current activity level 6  Objective: Vitals last 24 hrs: Vitals:   10/05/22 1536 10/05/22 2057 10/06/22 0613 10/06/22 1328  BP: (!) 168/110 (!) 143/98 127/87 (!) 150/106  Pulse:  (!) 101 99 (!) 103  Resp:  18 18 18   Temp:  99.3 F (37.4 C) 99.1 F (37.3 C) 99.3 F (37.4 C)  TempSrc:  Oral Oral Oral  SpO2:  100% 100% 99%  Weight:      Height:  Weight change:   Physical Examination: General exam: alert awake, older than stated age HEENT:Oral mucosa moist, Ear/Nose WNL grossly Respiratory system: bilaterally clear BS, no use of accessory muscle Cardiovascular system: S1 & S2 +, No JVD. Gastrointestinal system: Abdomen soft,NT,ND, BS+ Nervous System:Alert, awake, moving extremities. Extremities: LE edema neg,distal peripheral pulses palpable.  Skin: No rashes,no icterus. MSK: Normal muscle bulk,tone, power.  Medications  reviewed:  Scheduled Meds:  amLODipine  5 mg Oral Daily   enoxaparin (LOVENOX) injection  40 mg Subcutaneous Q24H   [START ON 10/07/2022] fluconazole  300 mg Oral Weekly   folic acid  1 mg Oral Daily   thiamine  100 mg Oral Daily   Or   thiamine  100 mg Intravenous Daily  Continuous Infusions:   Diet Order             Diet regular Room service appropriate? Yes; Fluid consistency: Thin  Diet effective now                  Intake/Output Summary (Last 24 hours) at 10/06/2022 1458 Last data filed at 10/06/2022 0900 Gross per 24 hour  Intake 1540 ml  Output 2000 ml  Net -460 ml   Net IO Since Admission: 7,091.31 mL [10/06/22 1458]  Wt Readings from Last 3 Encounters:  09/30/22 80.1 kg  09/30/22 80.1 kg  06/11/22 81 kg     Unresulted Labs (From admission, onward)     Start     Ordered   10/08/22 0500  Creatinine, serum  (enoxaparin (LOVENOX)    CrCl >/= 30 ml/min)  Weekly,   R     Comments: while on enoxaparin therapy    10/01/22 0639   10/04/22 0500  Comprehensive metabolic panel  Daily at 5am,   R      10/03/22 1325          Data Reviewed: I have personally reviewed following labs and imaging studies CBC: Recent Labs  Lab 09/30/22 1223 10/01/22 0451 10/02/22 0454 10/04/22 0425  WBC 7.8 7.8 6.2 5.5  NEUTROABS  --  5.3  --   --   HGB 13.3 11.6* 11.6* 11.9*  HCT 40.5 35.4* 34.9* 35.8*  MCV 97.6 97.5 96.9 95.2  PLT 317 316 360 450*   Basic Metabolic Panel: Recent Labs  Lab 10/01/22 0451 10/02/22 0454 10/03/22 0449 10/04/22 0425 10/05/22 0443 10/06/22 0418  NA 135 137 136 135 135 137  K 3.4* 3.5 3.6 3.2* 4.4 4.0  CL 102 102 100 101 103 103  CO2 19* 27 28 26 23 24   GLUCOSE 79 119* 110* 116* 121* 108*  BUN 10 5* <5* <5* 5* 6*  CREATININE 0.94 0.85 1.04 0.78 0.83 0.86  CALCIUM 8.8* 9.3 9.6 9.0 9.0 9.2  MG 1.9  --   --  1.8  --   --   PHOS 2.9  --   --   --   --   --    GFR: Estimated Creatinine Clearance: 97.8 mL/min (by C-G formula based on SCr  of 0.86 mg/dL). Liver Function Tests: Recent Labs  Lab 10/02/22 0454 10/03/22 0449 10/04/22 0425 10/05/22 0443 10/06/22 0418  AST 280* 381* 321* 330* 332*  ALT 152* 262* 292* 324* 375*  ALKPHOS 240* 289* 265* 273* 275*  BILITOT 3.3* 5.4* 5.7* 8.0* 8.6*  PROT 7.0 7.7 6.7 7.0 7.3  ALBUMIN 3.5 3.7 3.4* 3.5 3.6   Recent Labs  Lab 09/30/22 1223 10/01/22 0451 10/03/22 0449  LIPASE 2,156* 1,831*  543*  No results found for this or any previous visit (from the past 240 hour(s)).  Antimicrobials: Anti-infectives (From admission, onward)    Start     Dose/Rate Route Frequency Ordered Stop   10/07/22 0700  fluconazole (DIFLUCAN) tablet 300 mg        300 mg Oral Weekly 10/05/22 1601     09/30/22 2345  fluconazole (DIFLUCAN) tablet 300 mg  Status:  Discontinued        300 mg Oral Weekly 09/30/22 2251 09/30/22 2252   09/30/22 2345  fluconazole (DIFLUCAN) tablet 300 mg  Status:  Discontinued        300 mg Oral Weekly 09/30/22 2252 10/01/22 1531     Culture/Microbiology    Component Value Date/Time   SDES  09/09/2019 1251    ABSCESS Performed at [facility], [address] [place], [place] [address]    SPECREQUEST  09/09/2019 1251    NONE Performed at [facility], [address] [place], [place] [address]    CULT  09/09/2019 1251    MODERATE ESCHERICHIA COLI ABUNDANT STREPTOCOCCUS GROUP C MIXED ANAEROBIC FLORA PRESENT.  CALL LAB IF FURTHER IID REQUIRED.    REPTSTATUS 09/14/2019 FINAL 09/09/2019 1251  adiology Studies: No results found.  LOS: 6 days  Lanae Boast, MD [facility] 10/06/2022, 2:58 PM

## 2022-10-06 NOTE — Progress Notes (Signed)
Mobility Specialist - Progress Note   10/06/22 1119  Mobility  Activity Ambulated independently in hallway  Level of Assistance Modified independent, requires aide device or extra time  Distance Ambulated (ft) 600 ft  Activity Response Tolerated well  Mobility Referral Yes  $Mobility charge 1 Mobility   Pt received in bed and agreeable to mobility. No complaints during mobility. Pt to bed after session with all needs met.     [facility]

## 2022-10-06 NOTE — Progress Notes (Signed)
[facility] Gastroenterology Progress Note  Keylor Rands 61 y.o. 01-15-1961   Subjective: Patient seen and examined laying in bed.  Notes his abdominal pain continues to improve after bowel movement.  Denies blood in stool.  He is tolerating regular diet well.  Objective: Vital signs in last 24 hours: Vitals:   10/06/22 0613 10/06/22 1328  BP: 127/87 (!) 150/106  Pulse: 99 (!) 103  Resp: 18 18  Temp: 99.1 F (37.3 C) 99.3 F (37.4 C)  SpO2: 100% 99%    Physical Exam:  General:  Alert, cooperative, no distress, appears stated age  Head:  Normocephalic, without obvious abnormality, atraumatic  Eyes:  + icteric sclera, EOM's intact  Lungs:   Clear to auscultation bilaterally, respirations unlabored  Heart:  Regular rate and rhythm, S1, S2 normal  Abdomen:   Soft, non-tender, bowel sounds active all four quadrants,  no masses,   Extremities: Extremities normal, atraumatic, no  edema  Pulses: 2+ and symmetric    Lab Results: Recent Labs    10/04/22 0425 10/05/22 0443 10/06/22 0418  NA 135 135 137  K 3.2* 4.4 4.0  CL 101 103 103  CO2 26 23 24   GLUCOSE 301* 601* 108*  BUN <5* 5* 6*  CREATININE 0.78 0.83 0.86  CALCIUM 9.0 9.0 9.2  MG 1.8  --   --    Recent Labs    10/05/22 0443 10/06/22 0418  AST 330* 332*  ALT 324* 375*  ALKPHOS 273* 275*  BILITOT 8.0* 8.6*  PROT 7.0 7.3  ALBUMIN 3.5 3.6   Recent Labs    10/04/22 0425  WBC 5.5  HGB 11.9*  HCT 35.8*  MCV 95.2  PLT 450*   No results for input(s): "LABPROT", "INR" in the last 72 hours.    Assessment Acute pancreatitis due to alcohol use This cystic head mass of the pancreas Elevated LFTs Hyperbilirubinemia   Patient with improving abdominal pain.  Lab work notable for worsening LFTs as well as increasing hyperbilirubinemia.  HGB 11.9 Platelets 450 AST 332(330) ALT 375(324)  Alkphos 275(273) TBili 8.6(8.0)   MRCP 10/03/2022 1. Interval enlargement of a rim enhancing fluid collection in the central  pancreatic head measuring 6.3 x 3.7 x 3.3 cm, previously 4.9 x 3.1 x 2.8 cm. Findings are most consistent with an enlarging acute pancreatic fluid collection. Presence or absence of infection within this fluid is not established by CT. 2. Severe intra and extrahepatic biliary ductal dilatation, as well as severe pancreatic ductal dilatation, the ducts effaced centrally in the pancreatic head. 3. 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   Case discussed with Dr. Therisa Doyne with [facility] and Dr. Rush Landmark of [facility].  With patient CT scan will likely need EUS for evaluation of central pancreatic head cyst.  If no improvement in hyperbilirubinemia over the next couple days patient may also benefit from ERCP with stenting to help improve biliary flow.      Plan: Continue to monitor LFTs and bilirubin for improvement.  If continuing to worsen consider ERCP with possible stenting Friday afternoon. Continue regular diet Continue pain control and antiemetics as needed Continue MiraLAX 17 g daily as needed [facility] will follow  Charlott Rakes PA-C 10/06/2022, 2:12 PM  Contact #  [phone]

## 2022-10-07 DIAGNOSIS — K861 Other chronic pancreatitis: Secondary | ICD-10-CM

## 2022-10-07 DIAGNOSIS — K863 Pseudocyst of pancreas: Secondary | ICD-10-CM | POA: Insufficient documentation

## 2022-10-07 LAB — COMPREHENSIVE METABOLIC PANEL
ALT: 364 U/L — ABNORMAL HIGH (ref 0–44)
AST: 282 U/L — ABNORMAL HIGH (ref 15–41)
Albumin: 3.2 g/dL — ABNORMAL LOW (ref 3.5–5.0)
Alkaline Phosphatase: 276 U/L — ABNORMAL HIGH (ref 38–126)
Anion gap: 10 (ref 5–15)
BUN: 6 mg/dL — ABNORMAL LOW (ref 8–23)
CO2: 24 mmol/L (ref 22–32)
Calcium: 8.9 mg/dL (ref 8.9–10.3)
Chloride: 101 mmol/L (ref 98–111)
Creatinine, Ser: 0.88 mg/dL (ref 0.61–1.24)
GFR, Estimated: >60 mL/min (ref >60)
Glucose, Bld: 117 mg/dL — ABNORMAL HIGH (ref 70–99)
Potassium: 3.5 mmol/L (ref 3.5–5.1)
Sodium: 135 mmol/L (ref 135–145)
Total Bilirubin: 8 mg/dL — ABNORMAL HIGH (ref 0.3–1.2)
Total Protein: 6.9 g/dL (ref 6.5–8.1)

## 2022-10-07 NOTE — Progress Notes (Signed)
[facility] Gastroenterology Progress Note  Ralf Konopka 61 y.o. 1961-11-05    Subjective: Patient seen and examined sitting in bed.  Notes abdominal pain much improved today.  He was able to have 1 good bowel movement.  No blood in stool.  He is eating well.   Objective: Vital signs in last 24 hours: Vitals:   10/07/22 0551 10/07/22 1324  BP: 116/88 112/84  Pulse: (!) 104 100  Resp: 18 17  Temp: 98.6 F (37 C) 98.5 F (36.9 C)  SpO2: 100% 100%    Physical Exam:  General:  Alert, cooperative, no distress, appears stated age, jaundiced  Head:  Normocephalic, without obvious abnormality, atraumatic  Eyes:  +icteric sclera, EOM's intact  Lungs:   Clear to auscultation bilaterally, respirations unlabored  Heart:  Regular rate and rhythm, S1, S2 normal  Abdomen:   Soft, non-tender, bowel sounds active all four quadrants,  no masses,   Extremities: Extremities normal, atraumatic, no  edema  Pulses: 2+ and symmetric    Lab Results: Recent Labs    10/06/22 0418 10/07/22 0443  NA 137 135  K 4.0 3.5  CL 103 101  CO2 24 24  GLUCOSE 108* 117*  BUN 6* 6*  CREATININE 0.86 0.88  CALCIUM 9.2 8.9   Recent Labs    10/06/22 0418 10/07/22 0443  AST 332* 282*  ALT 375* 364*  ALKPHOS 275* 276*  BILITOT 8.6* 8.0*  PROT 7.3 6.9  ALBUMIN 3.6 3.2*   No results for input(s): "WBC", "NEUTROABS", "HGB", "HCT", "MCV", "PLT" in the last 72 hours. No results for input(s): "LABPROT", "INR" in the last 72 hours.    Assessment Acute pancreatitis due to alcohol use This cystic head mass of the pancreas Elevated LFTs Hyperbilirubinemia   Patient with improving abdominal pain.  Lab work notable for stable LFTs and hyperbilirubinemia.   HGB 11.9 Platelets 450 AST 282(332) ALT 364(375)  Alkphos 629(528) TBili 8.0(8.6)    MRCP 10/03/2022 1. Interval enlargement of a rim enhancing fluid collection in the central pancreatic head measuring 6.3 x 3.7 x 3.3 cm, previously 4.9 x 3.1 x 2.8  cm. Findings are most consistent with an enlarging acute pancreatic fluid collection. Presence or absence of infection within this fluid is not established by CT. 2. Severe intra and extrahepatic biliary ductal dilatation, as well as severe pancreatic ductal dilatation, the ducts effaced centrally in the pancreatic head. 3. 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   Case discussed with Dr. Therisa Doyne with [facility] and Dr. Rush Landmark of [facility].   If no improvement in hyperbilirubinemia over the next couple days patient may also benefit from ERCP with stenting to help improve biliary flow.      Plan: Continue to monitor LFTs and T. bili, tentative plan for ERCP Saturday. I thoroughly discussed the procedures to include nature, alternatives, benefits, and risks including but not limited to bleeding, perforation, infection, anesthesia/cardiac and pulmonary complications. Patient provides understanding and gave verbal consent to proceed. Continue Protonix 40mg  daily Continue regular diet NPO at midnight 11/4 Continue anti-emetics and supportive care as needed. [facility] will follow.     Arvella Nigh Venba Zenner PA-C 10/07/2022, 2:52 PM  Contact #  [phone]

## 2022-10-07 NOTE — Progress Notes (Addendum)
Patient refused Lovenox injection patient educated, MD made aware.

## 2022-10-07 NOTE — Progress Notes (Signed)
PROGRESS NOTE Jeremy Ewing  SVX:[id number] DOB: Sep 07, 1961 DOA: 09/30/2022 PCP: Jeani Sow, MD   Brief Narrative/Hospital Course: 61 year old male with history of alcohol abuse, hypertension, anemia admitted with working diagnosis of acute interstitial edematous pancreatitis/pancreatic head pseudocyst/AKI/metabolic acidosis liver dysfunction  He presented to ED 10/26 with epigastric pain and nausea, poor oral intake. In the ED lipase was more than 2000 CT abd/pelvis-continued large cystic area within the pancreatic head but decreasing in size , pancreatic ductal and biliary ductal dilation-stable since previous study 11/07/2021, mild stranding around the pancreas. Patient admitted GI consulted being managed conservatively   Subjective: Seen and examined this morning he feels great today the best he ever felt last few days.  His good friend at the bedside. Had a good bowel movement.     Assessment and Plan: Principal Problem:   Acute pancreatitis Active Problems:   ETOH abuse   AKI (acute kidney injury) (HCC)   Hyponatremia   Metabolic acidosis   Dehydration   Elevated LFTs   Pancreatic head pseudocyst Acute interstitial edematous pancreatitis pancreatitis Intra and extrahepatic biliary dilatation-pending attic duct dilatation Hepatic steatosis: CT finding as above. Tolerating solid food, being managed conservatively GI following>  LFTS slightly better today, continue to trend- if worsens further patient will need ERCP 10/08/2022 ,continue pain management, appreciate [facility] input.  Progressively rising LFTs/abnormal LFTs: See above plan for ERCP if continues to rise -total bili AST downtrendin Recent Labs  Lab 10/03/22 0449 10/04/22 0425 10/05/22 0443 10/06/22 0418 10/07/22 0443  AST 381* 321* 330* 332* 282*  ALT 262* 292* 324* 375* 364*  ALKPHOS 289* 265* 273* 275* 276*  BILITOT 5.4* 5.7* 8.0* 8.6* 8.0*  PROT 7.7 6.7 7.0 7.3 6.9  ALBUMIN 3.7 3.4* 3.5 3.6 3.2*      AKI Hyponatremia Metabolic acidosis  Hypokalemia: Renal function is stable , lites have improved.  Monitor closely.   Recent Labs  Lab 10/01/22 0451 10/02/22 0454 10/03/22 0449 10/04/22 0425 10/05/22 0443 10/06/22 0418 10/07/22 0443  NA 135   < > 136 135 135 137 135  K 3.4*   < > 3.6 3.2* 4.4 4.0 3.5  CL 102   < > 100 101 103 103 101  CO2 19*   < > 28 26 23 24 24   GLUCOSE 79   < > 110* 116* 121* 108* 117*  BUN 10   < > <5* <5* 5* 6* 6*  CREATININE 0.94   < > 1.04 0.78 0.83 0.86 0.88  CALCIUM 8.8*   < > 9.6 9.0 9.0 9.2 8.9  MG 1.9  --   --  1.8  --   --   --   PHOS 2.9  --   --   --   --   --   --    < > = values in this interval not displayed.   Hypertension: BP well controlled amlodipine started on 10/31, continue to monitor Normocytic anemia-hemoglobin stable in 11gms. Recent Labs  Lab 09/30/22 1223 10/01/22 0451 10/02/22 0454 10/04/22 0425  HGB 13.3 11.6* 11.6* 11.9*  HCT 40.5 35.4* 34.9* 35.8*     DVT prophylaxis: enoxaparin (LOVENOX) injection 40 mg Start: 10/01/22 1000 Code Status:   Code Status: Full Code Family Communication: plan of care discussed with patient at bedside. Patient status is: Inpatient because of liver dysfunction  Level of care: Med-Surg  Dispo: The patient is from: Home            Anticipated disposition: TBD.  Current activity level 6  Objective: Vitals last 24 hrs: Vitals:   10/06/22 1328 10/06/22 1842 10/06/22 2029 10/07/22 0551  BP: (!) 150/106 (!) 135/99 (!) 143/97 116/88  Pulse: (!) 103 (!) 109 (!) 104 (!) 104  Resp: 18 18 18 18   Temp: 99.3 F (37.4 C) 98.8 F (37.1 C) 98.6 F (37 C) 98.6 F (37 C)  TempSrc: Oral  Oral Oral  SpO2: 99% 100% 100% 100%  Weight:      Height:      Weight change:   Physical Examination: General exam: AAOX3, weak,older appearing HEENT:Oral mucosa moist, Ear/Nose WNL grossly, dentition normal. Respiratory system: bilaterally CLEAR BS, no use of accessory muscle Cardiovascular system: S1 &  S2 +, regular rate, JVD NEG. Gastrointestinal system: Abdomen soft, NT,ND,BS+ Nervous System:Alert, awake, moving extremities and grossly nonfocal Extremities: LE ankle edema NEG, lower extremities warm Skin: No rashes,no icterus. MSK: Normal muscle bulk,tone, power   Medications reviewed:  Scheduled Meds:  amLODipine  5 mg Oral Daily   enoxaparin (LOVENOX) injection  40 mg Subcutaneous Q24H   fluconazole  300 mg Oral Weekly   folic acid  1 mg Oral Daily   pantoprazole  40 mg Oral Daily   thiamine  100 mg Oral Daily   Or   thiamine  100 mg Intravenous Daily  Continuous Infusions:   Diet Order             Diet NPO time specified  Diet effective midnight           Diet regular Room service appropriate? Yes; Fluid consistency: Thin  Diet effective now                  Intake/Output Summary (Last 24 hours) at 10/07/2022 1213 Last data filed at 10/07/2022 0940 Gross per 24 hour  Intake 1440 ml  Output 3100 ml  Net -1660 ml    Net IO Since Admission: 5,551.31 mL [10/07/22 1213]  Wt Readings from Last 3 Encounters:  09/30/22 80.1 kg  09/30/22 80.1 kg  06/11/22 81 kg     Unresulted Labs (From admission, onward)     Start     Ordered   10/08/22 0500  Creatinine, serum  (enoxaparin (LOVENOX)    CrCl >/= 30 ml/min)  Weekly,   R     Comments: while on enoxaparin therapy    10/01/22 0639   10/04/22 0500  Comprehensive metabolic panel  Daily at 5am,   R      10/03/22 1325          Data Reviewed: I have personally reviewed following labs and imaging studies CBC: Recent Labs  Lab 09/30/22 1223 10/01/22 0451 10/02/22 0454 10/04/22 0425  WBC 7.8 7.8 6.2 5.5  NEUTROABS  --  5.3  --   --   HGB 13.3 11.6* 11.6* 11.9*  HCT 40.5 35.4* 34.9* 35.8*  MCV 97.6 97.5 96.9 95.2  PLT 317 316 360 450*    Basic Metabolic Panel: Recent Labs  Lab 10/01/22 0451 10/02/22 0454 10/03/22 0449 10/04/22 0425 10/05/22 0443 10/06/22 0418 10/07/22 0443  NA 135   < > 136 135  135 137 135  K 3.4*   < > 3.6 3.2* 4.4 4.0 3.5  CL 102   < > 100 101 103 103 101  CO2 19*   < > 28 26 23 24 24   GLUCOSE 79   < > 110* 116* 121* 108* 117*  BUN 10   < > <5* <5*  5* 6* 6*  CREATININE 0.94   < > 1.04 0.78 0.83 0.86 0.88  CALCIUM 8.8*   < > 9.6 9.0 9.0 9.2 8.9  MG 1.9  --   --  1.8  --   --   --   PHOS 2.9  --   --   --   --   --   --    < > = values in this interval not displayed.    GFR: Estimated Creatinine Clearance: 95.5 mL/min (by C-G formula based on SCr of 0.88 mg/dL). Liver Function Tests: Recent Labs  Lab 10/03/22 0449 10/04/22 0425 10/05/22 0443 10/06/22 0418 10/07/22 0443  AST 381* 321* 330* 332* 282*  ALT 262* 292* 324* 375* 364*  ALKPHOS 289* 265* 273* 275* 276*  BILITOT 5.4* 5.7* 8.0* 8.6* 8.0*  PROT 7.7 6.7 7.0 7.3 6.9  ALBUMIN 3.7 3.4* 3.5 3.6 3.2*    Recent Labs  Lab 09/30/22 1223 10/01/22 0451 10/03/22 0449  LIPASE 2,156* 1,831* 543*   No results found for this or any previous visit (from the past 240 hour(s)).  Antimicrobials: Anti-infectives (From admission, onward)    Start     Dose/Rate Route Frequency Ordered Stop   10/07/22 0700  fluconazole (DIFLUCAN) tablet 300 mg        300 mg Oral Weekly 10/05/22 1601     09/30/22 2345  fluconazole (DIFLUCAN) tablet 300 mg  Status:  Discontinued        300 mg Oral Weekly 09/30/22 2251 09/30/22 2252   09/30/22 2345  fluconazole (DIFLUCAN) tablet 300 mg  Status:  Discontinued        300 mg Oral Weekly 09/30/22 2252 10/01/22 1531     Culture/Microbiology    Component Value Date/Time   SDES  09/09/2019 1251    ABSCESS Performed at [facility], [address], [place], [place] [address]    SPECREQUEST  09/09/2019 1251    NONE Performed at [facility], [address] [place], [place] [address]    CULT  09/09/2019 1251    MODERATE ESCHERICHIA COLI ABUNDANT STREPTOCOCCUS GROUP C MIXED ANAEROBIC FLORA PRESENT.  CALL LAB IF FURTHER IID REQUIRED.     REPTSTATUS 09/14/2019 FINAL 09/09/2019 1251  adiology Studies: No results found.  LOS: 7 days  Lanae Boast, MD [facility] 10/07/2022, 12:14 PM

## 2022-10-08 DIAGNOSIS — R7989 Other specified abnormal findings of blood chemistry: Secondary | ICD-10-CM | POA: Diagnosis not present

## 2022-10-08 LAB — COMPREHENSIVE METABOLIC PANEL
ALT: 382 U/L — ABNORMAL HIGH (ref 0–44)
AST: 307 U/L — ABNORMAL HIGH (ref 15–41)
Albumin: 3.2 g/dL — ABNORMAL LOW (ref 3.5–5.0)
Alkaline Phosphatase: 303 U/L — ABNORMAL HIGH (ref 38–126)
Anion gap: 10 (ref 5–15)
BUN: 12 mg/dL (ref 8–23)
CO2: 23 mmol/L (ref 22–32)
Calcium: 8.7 mg/dL — ABNORMAL LOW (ref 8.9–10.3)
Chloride: 99 mmol/L (ref 98–111)
Creatinine, Ser: 1.08 mg/dL (ref 0.61–1.24)
GFR, Estimated: >60 mL/min (ref >60)
Glucose, Bld: 114 mg/dL — ABNORMAL HIGH (ref 70–99)
Potassium: 3.3 mmol/L — ABNORMAL LOW (ref 3.5–5.1)
Sodium: 132 mmol/L — ABNORMAL LOW (ref 135–145)
Total Bilirubin: 8.7 mg/dL — ABNORMAL HIGH (ref 0.3–1.2)
Total Protein: 6.7 g/dL (ref 6.5–8.1)

## 2022-10-08 MED ORDER — POTASSIUM CHLORIDE 10 MEQ/100ML IV SOLN
10.0000 meq | INTRAVENOUS | Status: AC
  Administered 2022-10-08 (×2): 10 meq via INTRAVENOUS
  Filled 2022-10-08 (×2): qty 100

## 2022-10-08 NOTE — Progress Notes (Signed)
[facility] Progress Note  Jeremy Ewing 61 y.o. 02/04/1961   Subjective: Patient seen and examined laying in bed, denies abdominal pain.  Tolerating regular diet well.  No new GI complaints.    Objective: Vital signs in last 24 hours: Vitals:   10/08/22 1000 10/08/22 1150  BP: 129/83 (!) 121/104  Pulse: 90 96  Resp:  17  Temp:  98.3 F (36.8 C)  SpO2:  100%    Physical Exam:  General:  Alert, cooperative, no distress, appears stated age  Head:  Normocephalic, without obvious abnormality, atraumatic  Eyes:  + icteric sclera, EOM's intact  Lungs:   Clear to auscultation bilaterally, respirations unlabored  Heart:  Regular rate and rhythm, S1, S2 normal  Abdomen:   Soft, non-tender, bowel sounds active all four quadrants,  no masses,   Extremities: Extremities normal, atraumatic, no  edema  Pulses: 2+ and symmetric    Lab Results: Recent Labs    10/07/22 0443 10/08/22 0451  NA 135 132*  K 3.5 3.3*  CL 101 99  CO2 24 23  GLUCOSE 117* 114*  BUN 6* 12  CREATININE 0.88 1.08  CALCIUM 8.9 8.7*   Recent Labs    10/07/22 0443 10/08/22 0451  AST 282* 307*  ALT 364* 382*  ALKPHOS 276* 303*  BILITOT 8.0* 8.7*  PROT 6.9 6.7  ALBUMIN 3.2* 3.2*   No results for input(s): "WBC", "NEUTROABS", "HGB", "HCT", "MCV", "PLT" in the last 72 hours. No results for input(s): "LABPROT", "INR" in the last 72 hours.    Assessment Acute pancreatitis due to alcohol use This cystic head mass of the pancreas Elevated LFTs Hyperbilirubinemia   Patient with improving abdominal pain.  Lab work notable for slightly worsened LFTs and hyperbilirubinemia.   HGB 11.9 Platelets 450 AST 307(282) ALT 382(364)  Alkphos 303(276) TBili 8.7(8.0)   MRCP 10/03/2022 1. Interval enlargement of a rim enhancing fluid collection in the central pancreatic head measuring 6.3 x 3.7 x 3.3 cm, previously 4.9 x 3.1 x 2.8 cm. Findings are most consistent with an enlarging acute pancreatic  fluid collection. Presence or absence of infection within this fluid is not established by CT. 2. Severe intra and extrahepatic biliary ductal dilatation, as well as severe pancreatic ductal dilatation, the ducts effaced centrally in the pancreatic head. 3. 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   Case discussed with Dr. Karki with [facility] and Dr. Mansouraty of [facility].   If no improvement in hyperbilirubinemia over the next couple days patient may also benefit from ERCP with stenting to help improve biliary flow.    Plan: Plan for ERCP tomorrow. I thoroughly discussed the procedures to include nature, alternatives, benefits, and risks including but not limited to bleeding, perforation, infection, anesthesia/cardiac and pulmonary complications. Patient provides understanding and gave verbal consent to proceed. Continue Protonix IV 40mg daily Continue regular diet NPO at midnight Continue anti-emetics and supportive care as needed. [facility] will follow.     Jeremy Fink N Kaven Cumbie PA-C 10/08/2022, 12:02 PM  Contact #  [phone]  

## 2022-10-08 NOTE — H&P (View-Only) (Signed)
[facility] Progress Note  Jeremy Ewing 61 y.o. 03/07/1961   Subjective: Patient seen and examined laying in bed, denies abdominal pain.  Tolerating regular diet well.  No new GI complaints.    Objective: Vital signs in last 24 hours: Vitals:   10/08/22 1000 10/08/22 1150  BP: 129/83 (!) 121/104  Pulse: 90 96  Resp:  17  Temp:  98.3 F (36.8 C)  SpO2:  100%    Physical Exam:  General:  Alert, cooperative, no distress, appears stated age  Head:  Normocephalic, without obvious abnormality, atraumatic  Eyes:  + icteric sclera, EOM's intact  Lungs:   Clear to auscultation bilaterally, respirations unlabored  Heart:  Regular rate and rhythm, S1, S2 normal  Abdomen:   Soft, non-tender, bowel sounds active all four quadrants,  no masses,   Extremities: Extremities normal, atraumatic, no  edema  Pulses: 2+ and symmetric    Lab Results: Recent Labs    10/07/22 0443 10/08/22 0451  NA 135 132*  K 3.5 3.3*  CL 101 99  CO2 24 23  GLUCOSE 117* 114*  BUN 6* 12  CREATININE 0.88 1.08  CALCIUM 8.9 8.7*   Recent Labs    10/07/22 0443 10/08/22 0451  AST 282* 307*  ALT 364* 382*  ALKPHOS 276* 303*  BILITOT 8.0* 8.7*  PROT 6.9 6.7  ALBUMIN 3.2* 3.2*   No results for input(s): "WBC", "NEUTROABS", "HGB", "HCT", "MCV", "PLT" in the last 72 hours. No results for input(s): "LABPROT", "INR" in the last 72 hours.    Assessment Acute pancreatitis due to alcohol use This cystic head mass of the pancreas Elevated LFTs Hyperbilirubinemia   Patient with improving abdominal pain.  Lab work notable for slightly worsened LFTs and hyperbilirubinemia.   HGB 11.9 Platelets 450 AST 307(282) ALT 382(364)  Alkphos 303(276) TBili 8.7(8.0)   MRCP 10/03/2022 1. Interval enlargement of a rim enhancing fluid collection in the central pancreatic head measuring 6.3 x 3.7 x 3.3 cm, previously 4.9 x 3.1 x 2.8 cm. Findings are most consistent with an enlarging acute pancreatic  fluid collection. Presence or absence of infection within this fluid is not established by CT. 2. Severe intra and extrahepatic biliary ductal dilatation, as well as severe pancreatic ductal dilatation, the ducts effaced centrally in the pancreatic head. 3. 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   Case discussed with Dr. Therisa Doyne with [facility] and Dr. Rush Landmark of [facility].   If no improvement in hyperbilirubinemia over the next couple days patient may also benefit from ERCP with stenting to help improve biliary flow.    Plan: Plan for ERCP tomorrow. I thoroughly discussed the procedures to include nature, alternatives, benefits, and risks including but not limited to bleeding, perforation, infection, anesthesia/cardiac and pulmonary complications. Patient provides understanding and gave verbal consent to proceed. Continue Protonix IV 40mg  daily Continue regular diet NPO at midnight Continue anti-emetics and supportive care as needed. [facility] will follow.     Arvella Nigh Aarthi Uyeno PA-C 10/08/2022, 12:02 PM  Contact #  [phone]

## 2022-10-08 NOTE — Anesthesia Preprocedure Evaluation (Addendum)
Anesthesia Evaluation  Patient identified by MRN, date of birth, ID band Patient awake    Reviewed: Allergy & Precautions, NPO status , Patient's Chart, lab work & pertinent test results  History of Anesthesia Complications Negative for: history of anesthetic complications  Airway Mallampati: II  TM Distance: >3 FB Neck ROM: Full    Dental no notable dental hx. (+) Dental Advisory Given   Pulmonary neg pulmonary ROS   Pulmonary exam normal        Cardiovascular negative cardio ROS Normal cardiovascular exam     Neuro/Psych negative neurological ROS     GI/Hepatic ,GERD  ,,(+)     substance abuse  alcohol use  Endo/Other  negative endocrine ROS    Renal/GU negative Renal ROS     Musculoskeletal negative musculoskeletal ROS (+)    Abdominal   Peds  Hematology negative hematology ROS (+)   Anesthesia Other Findings   Reproductive/Obstetrics                             Anesthesia Physical Anesthesia Plan  ASA: 3  Anesthesia Plan: General   Post-op Pain Management: Minimal or no pain anticipated   Induction: Intravenous  PONV Risk Score and Plan: 2 and Ondansetron and Midazolam  Airway Management Planned: Oral ETT  Additional Equipment:   Intra-op Plan:   Post-operative Plan: Extubation in OR  Informed Consent: I have reviewed the patients History and Physical, chart, labs and discussed the procedure including the risks, benefits and alternatives for the proposed anesthesia with the patient or authorized representative who has indicated his/her understanding and acceptance.     Dental advisory given  Plan Discussed with: CRNA and Anesthesiologist  Anesthesia Plan Comments:        Anesthesia Quick Evaluation

## 2022-10-08 NOTE — Progress Notes (Signed)
PROGRESS NOTE Jeremy Ewing  BOF:[id number] DOB: 10/18/1961 DOA: 09/30/2022 PCP: Jeani Sow, MD   Brief Narrative/Hospital Course: 61 year old male with history of alcohol abuse, hypertension, anemia admitted with working diagnosis of acute interstitial edematous pancreatitis/pancreatic head pseudocyst/AKI/metabolic acidosis liver dysfunction  He presented to ED 10/26 with epigastric pain and nausea, poor oral intake. In the ED lipase was more than 2000 CT abd/pelvis-continued large cystic area within the pancreatic head but decreasing in size , pancreatic ductal and biliary ductal dilation-stable since previous study 11/07/2021, mild stranding around the pancreas. Patient admitted GI consulted being managed conservatively His bilirubin continues to rise up-concerned about pancreatic head pseudocyst having some mass effect and obstruction   Subjective: Seen and examined.  No new complaints. Asking for second opinion from Dr. Dulce Sellar about need for stent Overnight afebrile, labs within hyponatremia hypokalemia LFTs continues to trend up    Assessment and Plan: Principal Problem:   Elevated LFTs Active Problems:   Acute pancreatitis   ETOH abuse   AKI (acute kidney injury) (HCC)   Hyponatremia   Metabolic acidosis   Dehydration   Pancreatic pseudocyst   Pancreatic head pseudocyst Acute interstitial edematous pancreatitis pancreatitis Intra and extrahepatic biliary dilatation-pending attic duct dilatation Hepatic steatosis Progressively rising LFTs: CT finding as above. Tolerating solid food, being managed conservatively GI following>  LFTS/ bilirubin continues to rise up-concerned about pancreatic head pseudocyst having some mass effect and obstructions> GI planning for possible ERCP with stent placement 11/4, will start diet.   Recent Labs  Lab 10/04/22 0425 10/05/22 0443 10/06/22 0418 10/07/22 0443 10/08/22 0451  AST 321* 330* 332* 282* 307*  ALT 292* 324* 375* 364*  382*  ALKPHOS 265* 273* 275* 276* 303*  BILITOT 5.7* 8.0* 8.6* 8.0* 8.7*  PROT 6.7 7.0 7.3 6.9 6.7  ALBUMIN 3.4* 3.5 3.6 3.2* 3.2*    AKI Hyponatremia Metabolic acidosis  Hypokalemia: Replete hypokalemia sodium slightly low.  Monitor   Hypertension: BP well controlled after initiating amlodipine on 10/31.  Continue to monitor  Normocytic anemia-hemoglobin stable in 11gms. Recent Labs  Lab 10/02/22 0454 10/04/22 0425  HGB 11.6* 11.9*  HCT 34.9* 35.8*    DVT prophylaxis: enoxaparin (LOVENOX) injection 40 mg Start: 10/01/22 1000 Code Status:   Code Status: Full Code Family Communication: plan of care discussed with patient at bedside. Patient status is: Inpatient because of liver dysfunction  Level of care: Med-Surg  Dispo: The patient is from: Home            Anticipated disposition: TBD.  Current activity level 6  Objective: Vitals last 24 hrs: Vitals:   10/07/22 1324 10/07/22 1741 10/07/22 1951 10/08/22 0545  BP: 112/84 (!) 125/94 116/79 129/83  Pulse: 100 96 95 90  Resp: 17 17 18 17   Temp: 98.5 F (36.9 C) 98.1 F (36.7 C) 98.9 F (37.2 C) 97.6 F (36.4 C)  TempSrc: Oral Oral Oral   SpO2: 100% 100% 99% 99%  Weight:      Height:      Weight change:   Physical Examination: General exam: AAOX3, weak,older appearing HEENT:Oral mucosa moist,  icteric, Ear/Nose WNL grossly, dentition normal. Respiratory system: bilaterally CLEAR BS, no use of accessory muscle Cardiovascular system: S1 & S2 +, regular rate, JVD neg. Gastrointestinal system: Abdomen soft, NT,ND,BS+ Nervous System:Alert, awake, moving extremities and grossly nonfocal Extremities: LE ankle edema neg, lower extremities warm Skin: No rashes,no icterus. MSK: Normal muscle bulk,tone, power   Medications reviewed:  Scheduled Meds:  amLODipine  5 mg  Oral Daily   enoxaparin (LOVENOX) injection  40 mg Subcutaneous Q24H   fluconazole  300 mg Oral Weekly   folic acid  1 mg Oral Daily   pantoprazole  40  mg Oral Daily   thiamine  100 mg Oral Daily   Or   thiamine  100 mg Intravenous Daily  Continuous Infusions:  potassium chloride       Diet Order             Diet NPO time specified  Diet effective midnight                  Intake/Output Summary (Last 24 hours) at 10/08/2022 0800 Last data filed at 10/07/2022 2100 Gross per 24 hour  Intake 1080 ml  Output 250 ml  Net 830 ml   Net IO Since Admission: 6,021.31 mL [10/08/22 0800]  Wt Readings from Last 3 Encounters:  09/30/22 80.1 kg  09/30/22 80.1 kg  06/11/22 81 kg     Unresulted Labs (From admission, onward)     Start     Ordered   10/08/22 0500  Creatinine, serum  (enoxaparin (LOVENOX)    CrCl >/= 30 ml/min)  Weekly,   R     Comments: while on enoxaparin therapy    10/01/22 0639   10/04/22 0500  Comprehensive metabolic panel  Daily at 5am,   R      10/03/22 1325          Data Reviewed: I have personally reviewed following labs and imaging studies CBC: Recent Labs  Lab 10/02/22 0454 10/04/22 0425  WBC 6.2 5.5  HGB 11.6* 11.9*  HCT 34.9* 35.8*  MCV 96.9 95.2  PLT 360 450*   Basic Metabolic Panel: Recent Labs  Lab 10/04/22 0425 10/05/22 0443 10/06/22 0418 10/07/22 0443 10/08/22 0451  NA 135 135 137 135 132*  K 3.2* 4.4 4.0 3.5 3.3*  CL 101 103 103 101 99  CO2 26 23 24 24 23   GLUCOSE 116* 121* 108* 117* 114*  BUN <5* 5* 6* 6* 12  CREATININE 0.78 0.83 0.86 0.88 1.08  CALCIUM 9.0 9.0 9.2 8.9 8.7*  MG 1.8  --   --   --   --    GFR: Estimated Creatinine Clearance: 77.8 mL/min (by C-G formula based on SCr of 1.08 mg/dL). Liver Function Tests: Recent Labs  Lab 10/04/22 0425 10/05/22 0443 10/06/22 0418 10/07/22 0443 10/08/22 0451  AST 321* 330* 332* 282* 307*  ALT 292* 324* 375* 364* 382*  ALKPHOS 265* 273* 275* 276* 303*  BILITOT 5.7* 8.0* 8.6* 8.0* 8.7*  PROT 6.7 7.0 7.3 6.9 6.7  ALBUMIN 3.4* 3.5 3.6 3.2* 3.2*   Recent Labs  Lab 10/03/22 0449  LIPASE 543*  No results found for  this or any previous visit (from the past 240 hour(s)).  Antimicrobials: Anti-infectives (From admission, onward)    Start     Dose/Rate Route Frequency Ordered Stop   10/07/22 0700  fluconazole (DIFLUCAN) tablet 300 mg        300 mg Oral Weekly 10/05/22 1601     09/30/22 2345  fluconazole (DIFLUCAN) tablet 300 mg  Status:  Discontinued        300 mg Oral Weekly 09/30/22 2251 09/30/22 2252   09/30/22 2345  fluconazole (DIFLUCAN) tablet 300 mg  Status:  Discontinued        300 mg Oral Weekly 09/30/22 2252 10/01/22 1531     Culture/Microbiology    Component Value Date/Time  SDES  09/09/2019 1251    ABSCESS Performed at [facility], [place] [address], [place], [place] [address]    SPECREQUEST  09/09/2019 1251    NONE Performed at [facility], [place] [address] [place], [place] [address]    CULT  09/09/2019 1251    MODERATE ESCHERICHIA COLI ABUNDANT STREPTOCOCCUS GROUP C MIXED ANAEROBIC FLORA PRESENT.  CALL LAB IF FURTHER IID REQUIRED.    REPTSTATUS 09/14/2019 FINAL 09/09/2019 1251  adiology Studies: No results found.  LOS: 8 days  Antonieta Pert, MD [facility] 10/08/2022, 8:00 AM

## 2022-10-08 NOTE — Progress Notes (Signed)
Mobility Specialist - Progress Note   10/08/22 1445  Mobility  Activity Ambulated independently in hallway  Level of Assistance Independent  Assistive Device None  Distance Ambulated (ft) 500 ft  Range of Motion/Exercises Active  Activity Response Tolerated well  Mobility Referral Yes  $Mobility charge 1 Mobility   Pt received in bench and agreeable to mobility. No complaints during mobility. Pt to room after session with all needs met.    [facility]

## 2022-10-09 ENCOUNTER — Inpatient Hospital Stay (HOSPITAL_COMMUNITY): Payer: Commercial Managed Care - HMO | Admitting: Anesthesiology

## 2022-10-09 ENCOUNTER — Encounter (HOSPITAL_COMMUNITY): Payer: Self-pay | Admitting: Internal Medicine

## 2022-10-09 ENCOUNTER — Inpatient Hospital Stay (HOSPITAL_COMMUNITY): Payer: Commercial Managed Care - HMO

## 2022-10-09 ENCOUNTER — Encounter (HOSPITAL_COMMUNITY)
Admission: EM | Disposition: A | Discharge status: Home / Self Care | Payer: Self-pay | Source: Home / Self Care | Attending: Internal Medicine

## 2022-10-09 DIAGNOSIS — K831 Obstruction of bile duct: Secondary | ICD-10-CM

## 2022-10-09 DIAGNOSIS — R7989 Other specified abnormal findings of blood chemistry: Secondary | ICD-10-CM | POA: Diagnosis not present

## 2022-10-09 DIAGNOSIS — K863 Pseudocyst of pancreas: Secondary | ICD-10-CM

## 2022-10-09 HISTORY — PX: SPHINCTEROTOMY: SHX5279

## 2022-10-09 HISTORY — PX: ERCP: SHX5425

## 2022-10-09 HISTORY — PX: BILIARY STENT PLACEMENT: SHX5538

## 2022-10-09 HISTORY — PX: REMOVAL OF STONES: SHX5545

## 2022-10-09 LAB — COMPREHENSIVE METABOLIC PANEL
ALT: 281 U/L — ABNORMAL HIGH (ref 0–44)
AST: 106 U/L — ABNORMAL HIGH (ref 15–41)
Albumin: 3.4 g/dL — ABNORMAL LOW (ref 3.5–5.0)
Alkaline Phosphatase: 278 U/L — ABNORMAL HIGH (ref 38–126)
Anion gap: 8 (ref 5–15)
BUN: 12 mg/dL (ref 8–23)
CO2: 24 mmol/L (ref 22–32)
Calcium: 9 mg/dL (ref 8.9–10.3)
Chloride: 105 mmol/L (ref 98–111)
Creatinine, Ser: 1.08 mg/dL (ref 0.61–1.24)
GFR, Estimated: >60 mL/min (ref >60)
Glucose, Bld: 115 mg/dL — ABNORMAL HIGH (ref 70–99)
Potassium: 3.8 mmol/L (ref 3.5–5.1)
Sodium: 137 mmol/L (ref 135–145)
Total Bilirubin: 3.5 mg/dL — ABNORMAL HIGH (ref 0.3–1.2)
Total Protein: 7.3 g/dL (ref 6.5–8.1)

## 2022-10-09 SURGERY — ERCP, WITH INTERVENTION IF INDICATED
Anesthesia: General

## 2022-10-09 MED ORDER — SODIUM CHLORIDE 0.9 % IV SOLN: INTRAVENOUS | Status: DC

## 2022-10-09 MED ORDER — FENTANYL CITRATE (PF) 100 MCG/2ML IJ SOLN
INTRAMUSCULAR | Status: DC | PRN
  Administered 2022-10-09 (×2): 50 ug via INTRAVENOUS

## 2022-10-09 MED ORDER — ROCURONIUM BROMIDE 10 MG/ML (PF) SYRINGE
PREFILLED_SYRINGE | INTRAVENOUS | Status: DC | PRN
  Administered 2022-10-09: 70 mg via INTRAVENOUS

## 2022-10-09 MED ORDER — MIDAZOLAM HCL 5 MG/5ML IJ SOLN
INTRAMUSCULAR | Status: DC | PRN
  Administered 2022-10-09: 2 mg via INTRAVENOUS

## 2022-10-09 MED ORDER — MIDAZOLAM HCL 2 MG/2ML IJ SOLN
INTRAMUSCULAR | Status: AC
  Filled 2022-10-09: qty 2

## 2022-10-09 MED ORDER — GLUCAGON HCL RDNA (DIAGNOSTIC) 1 MG IJ SOLR
INTRAMUSCULAR | Status: AC
  Filled 2022-10-09: qty 1

## 2022-10-09 MED ORDER — INDOMETHACIN 50 MG RE SUPP
RECTAL | Status: AC
  Filled 2022-10-09: qty 1

## 2022-10-09 MED ORDER — GLUCAGON HCL RDNA (DIAGNOSTIC) 1 MG IJ SOLR
INTRAMUSCULAR | Status: DC | PRN
  Administered 2022-10-09: .25 mg via INTRAVENOUS

## 2022-10-09 MED ORDER — PHENYLEPHRINE 80 MCG/ML (10ML) SYRINGE FOR IV PUSH (FOR BLOOD PRESSURE SUPPORT)
PREFILLED_SYRINGE | INTRAVENOUS | Status: DC | PRN
  Administered 2022-10-09 (×4): 160 ug via INTRAVENOUS

## 2022-10-09 MED ORDER — DICLOFENAC SUPPOSITORY 100 MG
RECTAL | Status: DC | PRN
  Administered 2022-10-09: 100 mg via RECTAL

## 2022-10-09 MED ORDER — CIPROFLOXACIN IN D5W 400 MG/200ML IV SOLN
400.0000 mg | Freq: Once | INTRAVENOUS | Status: DC
  Administered 2022-10-09: 400 mg via INTRAVENOUS

## 2022-10-09 MED ORDER — CIPROFLOXACIN IN D5W 400 MG/200ML IV SOLN
INTRAVENOUS | Status: DC | PRN
  Administered 2022-10-09: 400 mg via INTRAVENOUS

## 2022-10-09 MED ORDER — LACTATED RINGERS IV SOLN: INTRAVENOUS | Status: DC | PRN

## 2022-10-09 MED ORDER — CIPROFLOXACIN IN D5W 400 MG/200ML IV SOLN
INTRAVENOUS | Status: AC
  Filled 2022-10-09: qty 200

## 2022-10-09 MED ORDER — FENTANYL CITRATE (PF) 100 MCG/2ML IJ SOLN
INTRAMUSCULAR | Status: AC
  Filled 2022-10-09: qty 2

## 2022-10-09 MED ORDER — PROPOFOL 10 MG/ML IV BOLUS
INTRAVENOUS | Status: DC | PRN
  Administered 2022-10-09: 180 mg via INTRAVENOUS

## 2022-10-09 MED ORDER — ESMOLOL HCL 100 MG/10ML IV SOLN
INTRAVENOUS | Status: DC | PRN
  Administered 2022-10-09: 20 mg via INTRAVENOUS

## 2022-10-09 MED ORDER — SODIUM CHLORIDE 0.9 % IV SOLN
INTRAVENOUS | Status: DC | PRN
  Administered 2022-10-09: 20 mL

## 2022-10-09 MED ORDER — DICLOFENAC SUPPOSITORY 100 MG
RECTAL | Status: AC
  Filled 2022-10-09: qty 1

## 2022-10-09 MED ORDER — SUGAMMADEX SODIUM 500 MG/5ML IV SOLN
INTRAVENOUS | Status: DC | PRN
  Administered 2022-10-09: 300 mg via INTRAVENOUS

## 2022-10-09 MED ORDER — LIDOCAINE 2% (20 MG/ML) 5 ML SYRINGE
INTRAMUSCULAR | Status: DC | PRN
  Administered 2022-10-09: 100 mg via INTRAVENOUS

## 2022-10-09 MED ORDER — ONDANSETRON HCL 4 MG/2ML IJ SOLN
INTRAMUSCULAR | Status: DC | PRN
  Administered 2022-10-09: 4 mg via INTRAVENOUS

## 2022-10-09 MED ORDER — INDOMETHACIN 50 MG RE SUPP: 100.0000 mg | Freq: Once | RECTAL | Status: DC

## 2022-10-09 MED ORDER — PROPOFOL 10 MG/ML IV BOLUS
INTRAVENOUS | Status: AC
  Filled 2022-10-09: qty 20

## 2022-10-09 NOTE — Op Note (Signed)
[facility] Patient Name: Jeremy Ewing Procedure Date: 10/09/2022 MRN: [id number] Attending MD: Ronnette Juniper , MD, [id number] Date of Birth: May 10, 1961 CSN: [id number] Age: 61 Admit Type: Inpatient Procedure:                ERCP Indications:              Jaundice, Elevated liver enzymes, Pancreatic                            pseudocyst, pancreatitis Providers:                Ronnette Juniper, MD, Doristine Johns, RN, Cherylynn Ridges, Technician Referring MD:             [facility] Medicines:                Monitored Anesthesia Care Complications:            No immediate complications. Estimated blood loss:                            None Estimated Blood Loss:     Estimated blood loss: none. Procedure:                Pre-Anesthesia Assessment:                           - Prior to the procedure, a History and Physical                            was performed, and patient medications and                            allergies were reviewed. The patient's tolerance of                            previous anesthesia was also reviewed. The risks                            and benefits of the procedure and the sedation                            options and risks were discussed with the patient.                            All questions were answered, and informed consent                            was obtained. Prior Anticoagulants: The patient has                            taken no anticoagulant or antiplatelet agents. ASA                            Grade Assessment: III - A patient with  severe                            systemic disease. After reviewing the risks and                            benefits, the patient was deemed in satisfactory                            condition to undergo the procedure.                           After obtaining informed consent, the scope was                            passed under direct vision. Throughout the                             procedure, the patient's blood pressure, pulse, and                            oxygen saturations were monitored continuously. The                            [id number]-Q190V ([id number] Olympus duodenoscope was                            introduced through the mouth, and used to inject                            contrast into and used to inject contrast into the                            bile duct. The ERCP was accomplished without                            difficulty. The patient tolerated the procedure                            well. Scope In: Scope Out: Findings:      The scout film was normal. The esophagus was successfully intubated       under direct vision. The scope was advanced to a normal major papilla in       the descending duodenum without detailed examination of the pharynx,       larynx and associated structures, and upper GI tract. The upper GI tract       was grossly normal.      The ampulla appeared severely edematous and the ampullary opening was       noted embedded between thickended, edematous duodenal folds.      The bile duct was readily deeply cannulated with the sphincterotome on       the first attempt. Contrast was injected. I personally interpreted the       bile duct images. There was brisk flow of contrast through the ducts.       Image quality  was excellent. Contrast extended to the main bile duct.      The lower third of the main bile duct was partially obstructed by a       known pseudocyst. A straight Roadrunner wire was passed into the biliary       tree.      An 8 mm biliary sphincterotomy was made with a monofilament       sphincterotome using ERBE electrocautery. There was no       post-sphincterotomy bleeding.      The biliary tree was swept with a 12 mm and 9 mm balloon starting at the       bifurcation.      Sludge was swept from the duct.      One 8.5 Fr by 5 cm plastic stent with a single external flap and a       single  internal flap was placed 4 cm into the common bile duct. Bile       flowed through the stent. The stent was in good position.      The pancreatic duct was not canulated or injected intentionally.      The patient was given IV cipro 1 dose during the procedure and rectal       diclofenac at the start of the procedure. Impression:               - A biliary tract obstruction secondary to a known                            pseudocyst was found in the lower third of the main                            duct.                           - A biliary sphincterotomy was performed.                           - The biliary tree was swept and sludge was found.                           - One plastic stent was placed into the common bile                            duct. Moderate Sedation:      Patient did not receive moderate sedation for this procedure, but       instead received monitored anesthesia care. Recommendation:           - Full liquid diet.                           - Follow liver enzymes, if trending down and                            patient is pain free, ok to d/c home in am tomorrow.                           - Will need repeat imaging CT abdomen and pelvis  with contrast in 2-3 weeks to evaluate pancreatic                            pseudocysts, as he may need cyst drainage.                           - If cyst is decreasing or resolving on fu imaging,                            plan EGD for removal of plastic/temporary CBD stent                            in 6-8 weeks. Procedure Code(s):        --- Professional ---                           210 110 [id number], Endoscopic retrograde                            cholangiopancreatography (ERCP); with placement of                            endoscopic stent into biliary or pancreatic duct,                            including pre- and post-dilation and guide wire                            passage, when performed, including  sphincterotomy,                            when performed, each stent                           [id number], Endoscopic retrograde                            cholangiopancreatography (ERCP); with removal of                            calculi/debris from biliary/pancreatic duct(s)                           [id number], Endoscopic catheterization of the biliary                            ductal system, radiological supervision and                            interpretation Diagnosis Code(s):        --- Professional ---                           K83.1, Obstruction of bile duct  R17, Unspecified jaundice                           R74.8, Abnormal levels of other serum enzymes                           K86.3, Pseudocyst of pancreas CPT copyright 2022 [employer]. All rights reserved. The codes documented in this report are preliminary and upon coder review may  be revised to meet current compliance requirements. Kerin Salen, MD 10/09/2022 9:50:37 AM This report has been signed electronically. Number of Addenda: 0

## 2022-10-09 NOTE — Anesthesia Postprocedure Evaluation (Signed)
Anesthesia Post Note  Patient: Jeremy Ewing  Procedure(s) Performed: ENDOSCOPIC RETROGRADE CHOLANGIOPANCREATOGRAPHY (ERCP) BILIARY STENT PLACEMENT REMOVAL OF STONES SPHINCTEROTOMY     Patient location during evaluation: PACU Anesthesia Type: General Level of consciousness: sedated Pain management: pain level controlled Vital Signs Assessment: post-procedure vital signs reviewed and stable Respiratory status: spontaneous breathing and respiratory function stable Cardiovascular status: stable Postop Assessment: no apparent nausea or vomiting Anesthetic complications: no  No notable events documented.  Last Vitals:  Vitals:   10/09/22 1015 10/09/22 1028  BP: (!) 128/90 (!) 138/97  Pulse: 84 81  Resp: 16 16  Temp: 36.5 C 36.8 C  SpO2: 100% 100%    Last Pain:  Vitals:   10/09/22 1028  TempSrc: Oral  PainSc:                  Advik Weatherspoon DANIEL

## 2022-10-09 NOTE — Progress Notes (Signed)
[facility] PROGRESS NOTE Jeremy Ewing  YNW:[id number] DOB: 1961/06/12 DOA: 09/30/2022 PCP: Jeani Sow, MD   Brief Narrative/Hospital Course: 61 year old male with history of alcohol abuse, hypertension, anemia admitted with working diagnosis of acute interstitial edematous pancreatitis/pancreatic head pseudocyst/AKI/metabolic acidosis liver dysfunction  He presented to ED 10/26 with epigastric pain and nausea, poor oral intake. In the ED lipase was more than 2000 CT abd/pelvis-continued large cystic area within the pancreatic head but decreasing in size , pancreatic ductal and biliary ductal dilation-stable since previous study 11/07/2021, mild stranding around the pancreas. Patient admitted GI consulted being managed conservatively His bilirubin continues to rise up-concerned about pancreatic head pseudocyst having some mass effect and obstruction  Subjective: Seen and examined Patient underwent ERCP with stent placement this morning No complaints-wants to eat solid food Overnight patient has been afebrile Labs this morning significant drop in total bili to 3.5 AST ALT 106/281   Assessment and Plan: Principal Problem:   Elevated LFTs Active Problems:   Acute pancreatitis   ETOH abuse   AKI (acute kidney injury) (HCC)   Hyponatremia   Metabolic acidosis   Dehydration   Pancreatic pseudocyst  Pancreatic head pseudocyst Acute interstitial edematous pancreatitis pancreatitis Intra and extrahepatic biliary dilatation-pending attic duct dilatation Hepatic steatosis Progressively rising LFTs: CT finding as above. LFTS/ bilirubin remain elevated although some improvement this morning, underwent ERCP with biliary sphincterectomy, and plastic stent placement in the CBD, starting full liquid diet LFTs. Trend lfts  Recent Labs  Lab 10/05/22 0443 10/06/22 0418 10/07/22 0443 10/08/22 0451 10/09/22 0526  AST 330* 332* 282* 307* 106*  ALT 324* 375* 364* 382* 281*  ALKPHOS 273* 275* 276*  303* 278*  BILITOT 8.0* 8.6* 8.0* 8.7* 3.5*  PROT 7.0 7.3 6.9 6.7 7.3  ALBUMIN 3.5 3.6 3.2* 3.2* 3.4*    AKI Hyponatremia Metabolic acidosis  Hypokalemia: Resolved- cont to monitor   Hypertension: BP well controlled after initiating amlodipine on 10/31.Continue to monitor  Normocytic anemia-hemoglobin stable in 11gms. Recent Labs  Lab 10/04/22 0425  HGB 11.9*  HCT 35.8*    DVT prophylaxis: enoxaparin (LOVENOX) injection 40 mg Start: 10/01/22 1000 Code Status:   Code Status: Full Code Family Communication: plan of care discussed with patient at bedside. Patient status is: Inpatient because of liver dysfunction  Level of care: Med-Surg  Dispo: The patient is from: Home            Anticipated disposition: TBD.  Current activity level 6  Objective: Vitals last 24 hrs: Vitals:   10/09/22 1000 10/09/22 1015 10/09/22 1028 10/09/22 1144  BP: (!) 132/92 (!) 128/90 (!) 138/97 99/81  Pulse: 90 84 81 94  Resp: 12 16 16 16   Temp:  97.7 F (36.5 C) 98.3 F (36.8 C) 98.3 F (36.8 C)  TempSrc:   Oral Oral  SpO2: 100% 100% 100% 100%  Weight:      Height:      Weight change:   Physical Examination: General exam: AAOX3, weak,older appearing HEENT:Oral mucosa moist, Ear/Nose WNL grossly, dentition normal. Respiratory system: bilaterally clear BS, no use of accessory muscle Cardiovascular system: S1 & S2 +, regular rate. Gastrointestinal system: Abdomen soft, NT,ND,BS+ Nervous System:Alert, awake, moving extremities and grossly nonfocal Extremities: LE ankle edema neg, lower extremities warm Skin: No rashes,no icterus. MSK: Normal muscle bulk,tone, power   Medications reviewed:  Scheduled Meds:  amLODipine  5 mg Oral Daily   enoxaparin (LOVENOX) injection  40 mg Subcutaneous Q24H   fluconazole  300 mg Oral Weekly  folic acid  1 mg Oral Daily   indomethacin  100 mg Rectal Once   pantoprazole  40 mg Oral Daily   thiamine  100 mg Oral Daily   Or   thiamine  100 mg  Intravenous Daily  Continuous Infusions:  sodium chloride     ciprofloxacin        Diet Order             Diet NPO time specified  Diet effective now                  Intake/Output Summary (Last 24 hours) at 10/09/2022 1154 Last data filed at 10/09/2022 0950 Gross per 24 hour  Intake 840 ml  Output 1240 ml  Net -400 ml   Net IO Since Admission: 5,685.01 mL [10/09/22 1154]  Wt Readings from Last 3 Encounters:  09/30/22 80.1 kg  09/30/22 80.1 kg  06/11/22 81 kg     Unresulted Labs (From admission, onward)     Start     Ordered   10/08/22 0500  Creatinine, serum  (enoxaparin (LOVENOX)    CrCl >/= 30 ml/min)  Weekly,   R     Comments: while on enoxaparin therapy    10/01/22 3710          Data Reviewed: I have personally reviewed following labs and imaging studies CBC: Recent Labs  Lab 10/04/22 0425  WBC 5.5  HGB 11.9*  HCT 35.8*  MCV 95.2  PLT 626*   Basic Metabolic Panel: Recent Labs  Lab 10/04/22 0425 10/05/22 0443 10/06/22 0418 10/07/22 0443 10/08/22 0451 10/09/22 0526  NA 135 135 137 135 132* 137  K 3.2* 4.4 4.0 3.5 3.3* 3.8  CL 101 103 103 101 99 105  CO2 26 23 24 24 23 24   GLUCOSE 116* 121* 108* 117* 114* 115*  BUN <5* 5* 6* 6* 12 12  CREATININE 0.78 0.83 0.86 0.88 1.08 1.08  CALCIUM 9.0 9.0 9.2 8.9 8.7* 9.0  MG 1.8  --   --   --   --   --    GFR: Estimated Creatinine Clearance: 77.8 mL/min (by C-G formula based on SCr of 1.08 mg/dL). Liver Function Tests: Recent Labs  Lab 10/05/22 0443 10/06/22 0418 10/07/22 0443 10/08/22 0451 10/09/22 0526  AST 330* 332* 282* 307* 106*  ALT 324* 375* 364* 382* 281*  ALKPHOS 273* 275* 276* 303* 278*  BILITOT 8.0* 8.6* 8.0* 8.7* 3.5*  PROT 7.0 7.3 6.9 6.7 7.3  ALBUMIN 3.5 3.6 3.2* 3.2* 3.4*   Recent Labs  Lab 10/03/22 0449  LIPASE 543*  No results found for this or any previous visit (from the past 240 hour(s)).  Antimicrobials: Anti-infectives (From admission, onward)    Start      Dose/Rate Route Frequency Ordered Stop   10/09/22 1245  ciprofloxacin (CIPRO) IVPB 400 mg        400 mg 200 mL/hr over 60 Minutes Intravenous  Once 10/09/22 1150     10/07/22 0700  fluconazole (DIFLUCAN) tablet 300 mg        300 mg Oral Weekly 10/05/22 1601     09/30/22 2345  fluconazole (DIFLUCAN) tablet 300 mg  Status:  Discontinued        300 mg Oral Weekly 09/30/22 2251 09/30/22 2252   09/30/22 2345  fluconazole (DIFLUCAN) tablet 300 mg  Status:  Discontinued        300 mg Oral Weekly 09/30/22 2252 10/01/22 1531     Culture/Microbiology  Component Value Date/Time   SDES  09/09/2019 1251    ABSCESS Performed at [facility], [address], [place], [place] [address]    SPECREQUEST  09/09/2019 1251    NONE Performed at [facility], [address], [place], [place] [address]    CULT  09/09/2019 1251    MODERATE ESCHERICHIA COLI ABUNDANT STREPTOCOCCUS GROUP C MIXED ANAEROBIC FLORA PRESENT.  CALL LAB IF FURTHER IID REQUIRED.    REPTSTATUS 09/14/2019 FINAL 09/09/2019 1251  adiology Studies: DG ERCP  Result Date: 10/09/2022 CLINICAL DATA:  Pancreatitis EXAM: ERCP TECHNIQUE: Multiple spot images obtained with the fluoroscopic device and submitted for interpretation post-procedure. FLUOROSCOPY: Radiation Exposure Index (as provided by the fluoroscopic device): 15.79 mGy Kerma COMPARISON:  MRCP 10/03/2022 FINDINGS: Total of 11 intraoperative spot images are submitted for review. The images demonstrate a flexible duodenal scope in the descending duodenum followed by wire cannulation of the common bile duct. Subsequent cholangiography demonstrates smooth narrowing of the distal common bile duct with a tortuous and dilated proximal and mid common bile duct. Mild intrahepatic ductal dilatation is also present. Subsequent images demonstrate sphincterotomy and balloon sweeping of the duct. IMPRESSION: 1. Smooth narrowing of the mid to distal common bile  ducts likely due to external compression given prior MRCP findings. 2. ERCP with sphincterotomy and balloon sweeping of the common duct. These images were submitted for radiologic interpretation only. Please see the procedural report for the amount of contrast and the fluoroscopy time utilized. Electronically Signed   By: Malachy Moan M.D.   On: 10/09/2022 10:07    LOS: 9 days  Lanae Boast, MD [facility] 10/09/2022, 11:54 AM

## 2022-10-09 NOTE — Anesthesia Procedure Notes (Signed)
Procedure Name: Intubation Date/Time: 10/09/2022 9:02 AM  Performed by: Lind Covert, CRNAPre-anesthesia Checklist: Patient identified, Emergency Drugs available, Suction available, Patient being monitored and Timeout performed Patient Re-evaluated:Patient Re-evaluated prior to induction Oxygen Delivery Method: Circle system utilized Preoxygenation: Pre-oxygenation with 100% oxygen Induction Type: IV induction Ventilation: Mask ventilation without difficulty Laryngoscope Size: Mac and 4 Grade View: Grade I Tube type: Oral Tube size: 7.5 mm Number of attempts: 1 Airway Equipment and Method: Stylet Placement Confirmation: ETT inserted through vocal cords under direct vision, positive ETCO2 and breath sounds checked- equal and bilateral Secured at: 23 cm Tube secured with: Tape Dental Injury: Teeth and Oropharynx as per pre-operative assessment

## 2022-10-09 NOTE — Interval H&P Note (Signed)
History and Physical Interval Note: 61 year old male with pancreatic head pseudocyst, acute interstitial edematous pancreatitis causing intra and extrahepatic biliary ductal dilatation for an ERCP with CBD stent placement with propofol.  10/09/2022 8:55 AM  Jeremy Ewing  has presented today for ERCP, with the diagnosis of hyperbillirubinemia.  The various methods of treatment have been discussed with the patient and family. After consideration of risks, benefits and other options for treatment, the patient has consented to  Procedure(s): ENDOSCOPIC RETROGRADE CHOLANGIOPANCREATOGRAPHY (ERCP) (N/A) as a surgical intervention.  The patient's history has been reviewed, patient examined, no change in status, stable for surgery.  I have reviewed the patient's chart and labs.  Questions were answered to the patient's satisfaction.     Ronnette Juniper

## 2022-10-09 NOTE — Transfer of Care (Signed)
Immediate Anesthesia Transfer of Care Note  Patient: Jeremy Ewing  Procedure(s) Performed: ENDOSCOPIC RETROGRADE CHOLANGIOPANCREATOGRAPHY (ERCP) BILIARY STENT PLACEMENT REMOVAL OF STONES SPHINCTEROTOMY  Patient Location: PACU  Anesthesia Type:General  Level of Consciousness: sedated  Airway & Oxygen Therapy: Patient Spontanous Breathing and Patient connected to face mask oxygen  Post-op Assessment: Report given to RN and Post -op Vital signs reviewed and stable  Post vital signs: Reviewed and stable  Last Vitals:  Vitals Value Taken Time  BP    Temp    Pulse 99 10/09/22 0954  Resp    SpO2 100 % 10/09/22 0954  Vitals shown include unvalidated device data.  Last Pain:  Vitals:   10/09/22 0826  TempSrc: Oral  PainSc: 0-No pain      Patients Stated Pain Goal: 2 (28/36/62 9476)  Complications: No notable events documented.

## 2022-10-09 NOTE — Plan of Care (Signed)

## 2022-10-10 ENCOUNTER — Encounter (HOSPITAL_COMMUNITY): Payer: Self-pay | Admitting: Gastroenterology

## 2022-10-10 DIAGNOSIS — R7989 Other specified abnormal findings of blood chemistry: Secondary | ICD-10-CM | POA: Diagnosis not present

## 2022-10-10 LAB — COMPREHENSIVE METABOLIC PANEL
ALT: 196 U/L — ABNORMAL HIGH (ref 0–44)
AST: 63 U/L — ABNORMAL HIGH (ref 15–41)
Albumin: 3.1 g/dL — ABNORMAL LOW (ref 3.5–5.0)
Alkaline Phosphatase: 199 U/L — ABNORMAL HIGH (ref 38–126)
Anion gap: 6 (ref 5–15)
BUN: 8 mg/dL (ref 8–23)
CO2: 23 mmol/L (ref 22–32)
Calcium: 8.5 mg/dL — ABNORMAL LOW (ref 8.9–10.3)
Chloride: 107 mmol/L (ref 98–111)
Creatinine, Ser: 0.92 mg/dL (ref 0.61–1.24)
GFR, Estimated: >60 mL/min (ref >60)
Glucose, Bld: 105 mg/dL — ABNORMAL HIGH (ref 70–99)
Potassium: 3.8 mmol/L (ref 3.5–5.1)
Sodium: 136 mmol/L (ref 135–145)
Total Bilirubin: 2.7 mg/dL — ABNORMAL HIGH (ref 0.3–1.2)
Total Protein: 6.4 g/dL — ABNORMAL LOW (ref 6.5–8.1)

## 2022-10-10 LAB — CBC
HCT: 32.7 % — ABNORMAL LOW (ref 39.0–52.0)
Hemoglobin: 10.5 g/dL — ABNORMAL LOW (ref 13.0–17.0)
MCH: 32.1 pg (ref 26.0–34.0)
MCHC: 32.1 g/dL (ref 30.0–36.0)
MCV: 100 fL (ref 80.0–100.0)
Platelets: 685 10*3/uL — ABNORMAL HIGH (ref 150–400)
RBC: 3.27 MIL/uL — ABNORMAL LOW (ref 4.22–5.81)
RDW: 15.1 % (ref 11.5–15.5)
WBC: 5.5 10*3/uL (ref 4.0–10.5)
nRBC: 0 % (ref 0.0–0.2)

## 2022-10-10 MED ORDER — VITAMIN B-1 100 MG PO TABS: 100.0000 mg | ORAL_TABLET | Freq: Every day | ORAL | 0 refills | Status: DC

## 2022-10-10 MED ORDER — FOLIC ACID 1 MG PO TABS: 1.0000 mg | ORAL_TABLET | Freq: Every day | ORAL | 0 refills | Status: DC

## 2022-10-10 MED ORDER — AMLODIPINE BESYLATE 5 MG PO TABS: 5.0000 mg | ORAL_TABLET | Freq: Every day | ORAL | 0 refills | Status: DC

## 2022-10-10 MED ORDER — PANTOPRAZOLE SODIUM 40 MG PO TBEC: 40.0000 mg | DELAYED_RELEASE_TABLET | Freq: Every day | ORAL | 0 refills | Status: DC

## 2022-10-10 MED ORDER — FLUCONAZOLE 150 MG PO TABS: 300.0000 mg | ORAL_TABLET | Freq: Once | ORAL | 0 refills | Status: AC

## 2022-10-10 NOTE — Progress Notes (Signed)
Subjective: Denies abdominal pain, is going to be discharged home today.  Objective: Vital signs in last 24 hours: Temp:  [97.8 F (36.6 C)-99.1 F (37.3 C)] 98.5 F (36.9 C) (11/05 1016) Pulse Rate:  [71-84] 84 (11/05 1016) Resp:  [16-18] 18 (11/05 1016) BP: (120-137)/(80-94) 130/91 (11/05 1016) SpO2:  [100 %] 100 % (11/05 1016) Weight change:  Last BM Date : 10/08/22  PE: Minimal icterus, mild pallor GENERAL: Lying comfortably on bed, able to speak in full sentences  ABDOMEN: Soft, nondistended, nontender, normoactive bowel sounds EXTREMITIES: No deformity  Lab Results: Results for orders placed or performed during the hospital encounter of 09/30/22 (from the past 48 hour(s))  Comprehensive metabolic panel     Status: Abnormal   Collection Time: 10/09/22  5:26 AM  Result Value Ref Range   Sodium 137 135 - 145 mmol/L   Potassium 3.8 3.5 - 5.1 mmol/L   Chloride 105 98 - 111 mmol/L   CO2 24 22 - 32 mmol/L   Glucose, Bld 115 (H) 70 - 99 mg/dL    Comment: Glucose reference range applies only to samples taken after fasting for at least 8 hours.   BUN 12 8 - 23 mg/dL   Creatinine, Ser 1.08 0.61 - 1.24 mg/dL   Calcium 9.0 8.9 - 10.3 mg/dL   Total Protein 7.3 6.5 - 8.1 g/dL   Albumin 3.4 (L) 3.5 - 5.0 g/dL   AST 106 (H) 15 - 41 U/L   ALT 281 (H) 0 - 44 U/L   Alkaline Phosphatase 278 (H) 38 - 126 U/L   Total Bilirubin 3.5 (H) 0.3 - 1.2 mg/dL    Comment: DELTA CHECK NOTED   GFR, Estimated >60 >60 mL/min    Comment: (NOTE) Calculated using the CKD-EPI Creatinine Equation (2021)    Anion gap 8 5 - 15    Comment: Performed at [facility], [place] [address] [place], [place] [address]  CBC     Status: Abnormal   Collection Time: 10/10/22  4:19 AM  Result Value Ref Range   WBC 5.5 4.0 - 10.5 K/uL   RBC 3.27 (L) 4.22 - 5.81 MIL/uL   Hemoglobin 10.5 (L) 13.0 - 17.0 g/dL   HCT 32.7 (L) 39.0 - 52.0 %   MCV 100.0 80.0 - 100.0 fL   MCH 32.1 26.0 - 34.0 pg   MCHC  32.1 30.0 - 36.0 g/dL   RDW 15.1 11.5 - 15.5 %   Platelets 685 (H) 150 - 400 K/uL   nRBC 0.0 0.0 - 0.2 %    Comment: Performed at [facility], [place] [address] [place],  [address]  Comprehensive metabolic panel     Status: Abnormal   Collection Time: 10/10/22  4:19 AM  Result Value Ref Range   Sodium 136 135 - 145 mmol/L   Potassium 3.8 3.5 - 5.1 mmol/L   Chloride 107 98 - 111 mmol/L   CO2 23 22 - 32 mmol/L   Glucose, Bld 105 (H) 70 - 99 mg/dL    Comment: Glucose reference range applies only to samples taken after fasting for at least 8 hours.   BUN 8 8 - 23 mg/dL   Creatinine, Ser 0.92 0.61 - 1.24 mg/dL   Calcium 8.5 (L) 8.9 - 10.3 mg/dL   Total Protein 6.4 (L) 6.5 - 8.1 g/dL   Albumin 3.1 (L) 3.5 - 5.0 g/dL   AST 63 (H) 15 - 41 U/L   ALT 196 (H) 0 -  44 U/L   Alkaline Phosphatase 199 (H) 38 - 126 U/L   Total Bilirubin 2.7 (H) 0.3 - 1.2 mg/dL   GFR, Estimated >93 >79 mL/min    Comment: (NOTE) Calculated using the CKD-EPI Creatinine Equation (2021)    Anion gap 6 5 - 15    Comment: Performed at [facility], [address] [place], [place] [address]    Studies/Results: DG ERCP  Result Date: 10/09/2022 CLINICAL DATA:  Pancreatitis EXAM: ERCP TECHNIQUE: Multiple spot images obtained with the fluoroscopic device and submitted for interpretation post-procedure. FLUOROSCOPY: Radiation Exposure Index (as provided by the fluoroscopic device): 15.79 mGy Kerma COMPARISON:  MRCP 10/03/2022 FINDINGS: Total of 11 intraoperative spot images are submitted for review. The images demonstrate a flexible duodenal scope in the descending duodenum followed by wire cannulation of the common bile duct. Subsequent cholangiography demonstrates smooth narrowing of the distal common bile duct with a tortuous and dilated proximal and mid common bile duct. Mild intrahepatic ductal dilatation is also present. Subsequent images demonstrate sphincterotomy and balloon  sweeping of the duct. IMPRESSION: 1. Smooth narrowing of the mid to distal common bile ducts likely due to external compression given prior MRCP findings. 2. ERCP with sphincterotomy and balloon sweeping of the common duct. These images were submitted for radiologic interpretation only. Please see the procedural report for the amount of contrast and the fluoroscopy time utilized. Electronically Signed   By: Malachy Moan M.D.   On: 10/09/2022 10:07    Medications: I have reviewed the patient's current medications.  Assessment: Status post ERCP with plastic CBD stent placed for biliary obstruction from pancreatic pseudocyst LFTs improved, T. bili 2.7, AST 63, ALT 196, ALP 199  Normocytic anemia, hemoglobin 10.5 with reactive thrombocytosis, platelets 685  Pancreatitis, 6.3 x 3.7 x 3.3 cm rim-enhancing fluid collection in pancreatic head with severe pancreatic ductal dilatation  Plan: Advised patient to get a CAT scan of the abdomen and pelvis with contrast in 2 to 3 weeks. If pancreatic cysts are decreasing in size or resolving, I will plan subsequent outpatient EGD for plastic CBD stent removal. Discussed with patient in details and reiterated that plastic CBD stent is temporary and will need to be removed otherwise may cause complications such as obstruction, cholangitis, severe infection. If pancreatic pseudocysts are enlarging or getting organized, patient may need cyst gastrostomy in the future. Most importantly patient needs to completely stop drinking alcohol, this has been discussed with the patient in details, he verbalizes understanding.  Kerin Salen, MD 10/10/2022, 11:56 AM

## 2022-10-10 NOTE — Progress Notes (Signed)
Reviewed written d/c instructions w pt and all questions answered. He verbalized understanding. D/C ambulatory w all belongings in stable condition 

## 2022-10-10 NOTE — Discharge Summary (Signed)
Physician Discharge Summary  Majesty Oehlert GEX:[id number] DOB: [birth year] DOA: 09/30/2022  PCP: Jeani Sow, MD  Admit date: 09/30/2022 Discharge date: 10/10/2022 Recommendations for Outpatient Follow-up:  Follow up with PCP in 1 weeks-call for appointment Please obtain BMP/CBC in one week  Discharge Dispo: Home Discharge Condition: Stable Code Status:   Code Status: Full Code Diet recommendation:  Diet Order             Diet regular Room service appropriate? Yes; Fluid consistency: Thin  Diet effective now                 Brief/Interim Summary: 61 year old male with history of alcohol abuse, hypertension, anemia admitted with working diagnosis of acute interstitial edematous pancreatitis/pancreatic head pseudocyst/AKI/metabolic acidosis liver dysfunction He presented to ED 10/26 with epigastric pain and nausea, poor oral intake. In the ED lipase was more than 2000 CT abd/pelvis-continued large cystic area within the pancreatic head but decreasing in size , pancreatic ductal and biliary ductal dilation-stable since previous study 11/07/2021, mild stranding around the pancreas. Patient admitted GI consulted being managed conservatively His bilirubin continues to rise up-concerned about pancreatic head pseudocyst having some mass effect and obstruction Underwent ERCP with plastic stent placement 11/4, repeat LFTs 11/5 nicely improving, patient tolerating diet. Patient will be discharged once okay with GI- per he will need repeat imaging CT abdomen and pelvis with contrast in 2-3 weeks for evaluation of pancreatic cyst and then EGD for stent removal thereafteter"   Discharge Diagnoses:  Principal Problem:   Elevated LFTs Active Problems:   Acute pancreatitis   ETOH abuse   AKI (acute kidney injury) (HCC)   Hyponatremia   Metabolic acidosis   Dehydration   Pancreatic pseudocyst  Pancreatic head pseudocyst Acute interstitial edematous pancreatitis pancreatitis Intra and  extrahepatic biliary dilatation-pending attic duct dilatation Hepatic steatosis Progressively rising LFTs: CT finding as above. LFTS/ bilirubin remain elevated s/p  ERCP with biliary sphincterectomy, and plastic stent placement in the CBD, tolerating diet LFTs downtrending Repeat CT scan and stent removal as advised by GI-GI and PCP phone no for follow-up Again it was extensively discussed with the patient that he needs his plastic stent removal and repeat CT scan in 2 to 3 weeks-he verbalized understanding and he will follow-up on those.  We discussed alcohol cessation.  AKI Hyponatremia Metabolic acidosis  Hypokalemia: Resolved- cont to monitor    Hypertension: BP well controlled after initiating amlodipine on 10/31. Normocytic anemia-hemoglobin stable in 11gms  Consults: Gastroenterology Subjective: Alert awake oriented resting comfortably no abdominal pain  Discharge Exam: Vitals:   10/10/22 0510 10/10/22 1016  BP: (!) 130/90 (!) 130/91  Pulse: 80 84  Resp: 16 18  Temp:  98.5 F (36.9 C)  SpO2: 100% 100%   General: Pt is alert, awake, not in acute distress Cardiovascular: RRR, S1/S2 +, no rubs, no gallops Respiratory: CTA bilaterally, no wheezing, no rhonchi Abdominal: Soft, NT, ND, bowel sounds + Extremities: no edema, no cyanosis  Discharge Instructions  Discharge Instructions     Discharge instructions   Complete by: As directed    Please call call MD or return to ER for similar or worsening recurring problem that brought you to hospital or if any fever,nausea/vomiting,abdominal pain, uncontrolled pain, chest pain,  shortness of breath or any other alarming symptoms.  you need repeat imaging CT abdomen and pelvis with contrast in 2-3 weeks for evaluation of pancreatic cyst and then EGD for stent removal thereafteter  Please follow-up your doctor as instructed  in a week time and call the office for appointment.  Please avoid alcohol, smoking, or any other  illicit substance and maintain healthy habits including taking your regular medications as prescribed.  You were cared for by a hospitalist during your hospital stay. If you have any questions about your discharge medications or the care you received while you were in the hospital after you are discharged, you can call the unit and ask to speak with the hospitalist on call if the hospitalist that took care of you is not available.  Once you are discharged, your primary care physician will handle any further medical issues. Please note that NO REFILLS for any discharge medications will be authorized once you are discharged, as it is imperative that you return to your primary care physician (or establish a relationship with a primary care physician if you do not have one) for your aftercare needs so that they can reassess your need for medications and monitor your lab values   Increase activity slowly   Complete by: As directed       Allergies as of 10/10/2022   No Known Allergies      Medication List     TAKE these medications    acetaminophen 500 MG tablet Commonly known as: TYLENOL   amLODipine 5 MG tablet Commonly known as: NORVASC Take 1 tablet (5 mg total) by mouth daily. Start taking on: October 11, 2022   fluconazole 150 MG tablet Commonly known as: DIFLUCAN Take 2 tablets (300 mg total) by mouth once for 1 dose. Start taking on: October 12, 1274   folic acid 1 MG tablet Commonly known as: FOLVITE Take 1 tablet (1 mg total) by mouth daily. Start taking on: October 11, 2022   pantoprazole 40 MG tablet Commonly known as: PROTONIX Take 1 tablet (40 mg total) by mouth daily. Start taking on: October 11, 2022   thiamine 100 MG tablet Commonly known as: Vitamin B-1 Take 1 tablet (100 mg total) by mouth daily. Start taking on: October 11, 2022        Follow-up Information     Tawnya Crook, MD Follow up in 1 week(s).   Specialty: Family Medicine Contact  information: [place] [address] [phone]         Ronnette Juniper, MD Follow up in 2 week(s).   Specialty: Gastroenterology Contact information: [place] [address] [phone]                No Known Allergies  The results of significant diagnostics from this hospitalization (including imaging, microbiology, ancillary and laboratory) are listed below for reference.    Microbiology: No results found for this or any previous visit (from the past 240 hour(s)).  Procedures/Studies: DG ERCP  Result Date: 10/09/2022 CLINICAL DATA:  Pancreatitis EXAM: ERCP TECHNIQUE: Multiple spot images obtained with the fluoroscopic device and submitted for interpretation post-procedure. FLUOROSCOPY: Radiation Exposure Index (as provided by the fluoroscopic device): 15.79 mGy Kerma COMPARISON:  MRCP 10/03/2022 FINDINGS: Total of 11 intraoperative spot images are submitted for review. The images demonstrate a flexible duodenal scope in the descending duodenum followed by wire cannulation of the common bile duct. Subsequent cholangiography demonstrates smooth narrowing of the distal common bile duct with a tortuous and dilated proximal and mid common bile duct. Mild intrahepatic ductal dilatation is also present. Subsequent images demonstrate sphincterotomy and balloon sweeping of the duct. IMPRESSION: 1. Smooth narrowing of the mid to distal  common bile ducts likely due to external compression given prior MRCP findings. 2. ERCP with sphincterotomy and balloon sweeping of the common duct. These images were submitted for radiologic interpretation only. Please see the procedural report for the amount of contrast and the fluoroscopy time utilized. Electronically Signed   By: Malachy Moan M.D.   On: 10/09/2022 10:07   MR ABDOMEN MRCP W WO CONTAST  Result Date: 10/03/2022 CLINICAL DATA:  Pancreatitis, severe jaundice, abdominal pain EXAM: MRI ABDOMEN  WITHOUT AND WITH CONTRAST (INCLUDING MRCP) TECHNIQUE: Multiplanar multisequence MR imaging of the abdomen was performed both before and after the administration of intravenous contrast. Heavily T2-weighted images of the biliary and pancreatic ducts were obtained, and three-dimensional MRCP images were rendered by post processing. CONTRAST:  63mL GADAVIST GADOBUTROL 1 MMOL/ML IV SOLN COMPARISON:  09/30/2022 FINDINGS: Lower chest: No acute abnormality. Hepatobiliary: No solid liver abnormality is seen. Distended gallbladder. No gallstones gallbladder wall thickening. Severe intra and extrahepatic biliary ductal dilatation, the common bile duct measuring up to 1.4 cm, increased compared to prior examination. The common bile duct is effaced centrally. Pancreas: Inflammatory fat stranding and fluid about the pancreatic head. Interval enlargement of a rim enhancing fluid collection in the central pancreatic head measuring 6.3 x 3.7 x 3.3 cm, previously 4.9 x 3.1 x 2.8 cm (series 18, image 54). Severe pancreatic ductal dilatation along its length, measuring up to 1.3 cm in the pancreatic neck, increased, previously 1.1 cm (series 18, image 45). The central pancreatic duct is effaced in the pancreatic head. 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 Spleen: Normal in size without significant abnormality. Adrenals/Urinary Tract: Adrenal glands are unremarkable. Kidneys are normal, without renal calculi, solid lesion, or hydronephrosis. Stomach/Bowel: Stomach is within normal limits. No evidence of bowel wall thickening, distention, or inflammatory changes. Vascular/Lymphatic: No significant vascular findings are present. No enlarged abdominal lymph nodes. Other: No abdominal wall hernia or abnormality. No ascites.  Musculoskeletal: No acute or significant osseous findings. IMPRESSION: 1. Interval enlargement of a rim enhancing fluid collection in the central pancreatic head measuring 6.3 x 3.7 x 3.3 cm, previously 4.9 x 3.1 x 2.8 cm. Findings are most consistent with an enlarging acute pancreatic fluid collection. Presence or absence of infection within this fluid is not established by CT. 2. Severe intra and extrahepatic biliary ductal dilatation, as well as severe pancreatic ductal dilatation, the ducts effaced centrally in the pancreatic head. 3. 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 Electronically Signed   By: Jearld Lesch M.D.   On: 10/03/2022 15:07   MR 3D Recon At Scanner  Result Date: 10/03/2022 CLINICAL DATA:  Pancreatitis, severe jaundice, abdominal pain EXAM: MRI ABDOMEN WITHOUT AND WITH CONTRAST (INCLUDING MRCP) TECHNIQUE: Multiplanar multisequence MR imaging of the abdomen was performed both before and after the administration of intravenous contrast. Heavily T2-weighted images of the biliary and pancreatic ducts were obtained, and three-dimensional MRCP images were rendered by post processing. CONTRAST:  38mL GADAVIST GADOBUTROL 1 MMOL/ML IV SOLN COMPARISON:  09/30/2022 FINDINGS: Lower chest: No acute abnormality. Hepatobiliary: No solid liver abnormality is seen. Distended gallbladder. No gallstones gallbladder wall thickening. Severe intra and extrahepatic biliary ductal dilatation, the common bile duct measuring  up to 1.4 cm, increased compared to prior examination. The common bile duct is effaced centrally. Pancreas: Inflammatory fat stranding and fluid about the pancreatic head. Interval enlargement of a rim enhancing fluid collection in the central pancreatic head measuring 6.3 x 3.7 x 3.3 cm,  previously 4.9 x 3.1 x 2.8 cm (series 18, image 54). Severe pancreatic ductal dilatation along its length, measuring up to 1.3 cm in the pancreatic neck, increased, previously 1.1 cm (series 18, image 45). The central pancreatic duct is effaced in the pancreatic head. 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 Spleen: Normal in size without significant abnormality. Adrenals/Urinary Tract: Adrenal glands are unremarkable. Kidneys are normal, without renal calculi, solid lesion, or hydronephrosis. Stomach/Bowel: Stomach is within normal limits. No evidence of bowel wall thickening, distention, or inflammatory changes. Vascular/Lymphatic: No significant vascular findings are present. No enlarged abdominal lymph nodes. Other: No abdominal wall hernia or abnormality. No ascites. Musculoskeletal: No acute or significant osseous findings. IMPRESSION: 1. Interval enlargement of a rim enhancing fluid collection in the central pancreatic head measuring 6.3 x 3.7 x 3.3 cm, previously 4.9 x 3.1 x 2.8 cm. Findings are most consistent with an enlarging acute pancreatic fluid collection. Presence or absence of infection within this fluid is not established by CT. 2. Severe intra and extrahepatic biliary ductal dilatation, as well as severe pancreatic ductal dilatation, the ducts effaced centrally in the pancreatic head. 3. 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 Electronically Signed   By: Jearld Lesch M.D.   On: 10/03/2022 15:07   US Abdomen Limited RUQ (LIVER/GB)  Result Date: 10/02/2022 CLINICAL DATA:  Abnormal liver  function test. EXAM: ULTRASOUND ABDOMEN LIMITED RIGHT UPPER QUADRANT COMPARISON:  CT, 09/30/2022. FINDINGS: Gallbladder: Gallbladder is distended with dependent sludge. No stones. No wall thickening or pericholecystic fluid. Common bile duct: Diameter: Intra and extrahepatic bile duct dilation, common bile duct measuring 1.2 cm. This is stable from the recent prior CT. Liver: Increased parenchymal echogenicity. Intrahepatic bile duct dilation. No liver mass. Portal vein is patent on color Doppler imaging with normal direction of blood flow towards the liver. Other: Cystic mass in the pancreatic head, 5.5 x 3.3 x 4.8 cm, as noted on the recent prior CT. IMPRESSION: 1. No gallstones or evidence of acute cholecystitis. Distended gallbladder with dependent sludge. 2. Intra and extrahepatic bile duct dilation, similar to that seen on the recent prior CT. Cystic pancreatic head mass is the presumed cause of the duct dilation. 3. Increased liver parenchymal echogenicity consistent with hepatic steatosis. Electronically Signed   By: Amie Portland M.D.   On: 10/02/2022 15:14   CT Abdomen Pelvis W Contrast  Result Date: 09/30/2022 CLINICAL DATA:  Abdominal pain, acute, nonlocalized generalized pain. pancreatitis EXAM: CT ABDOMEN AND PELVIS WITH CONTRAST TECHNIQUE: Multidetector CT imaging of the abdomen and pelvis was performed using the standard protocol following bolus administration of intravenous contrast. RADIATION DOSE REDUCTION: This exam was performed according to the departmental dose-optimization program which includes automated exposure control, adjustment of the mA and/or kV according to patient size and/or use of iterative reconstruction technique. CONTRAST:  85mL OMNIPAQUE IOHEXOL 300 MG/ML  SOLN COMPARISON:  11/07/2021 FINDINGS: Lower chest: No acute abnormality  Hepatobiliary: Diffuse low-density compatible with fatty infiltration. No focal hepatic abnormality. Gallbladder grossly unremarkable. There is  intrahepatic and extrahepatic biliary ductal dilatation, likely related to pancreatic head cystic mass. Pancreas: 4.9 x 3.0 x 2.8 cm cystic mass within the pancreatic head. Associated pancreatic ductal dilatation measuring up to 11 mm and biliary ductal dilatation. Mild stranding adjacent to the pancreas. Overall, the cystic mass has decreased in size since prior study and the ductal dilatation is similar to prior study. 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 Spleen: No focal abnormality.  Normal size. Adrenals/Urinary Tract: No adrenal abnormality. No focal renal abnormality. No stones or hydronephrosis. Urinary bladder is unremarkable. Stomach/Bowel: Normal appendix. Scattered colonic diverticulosis. No active diverticulitis. Stomach and small bowel decompressed, unremarkable. Vascular/Lymphatic: Aortic atherosclerosis. No evidence of aneurysm or adenopathy. Reproductive: No visible focal abnormality. Other: No free fluid or free air. Musculoskeletal: No acute bony abnormality or suspicious focal bone lesion. IMPRESSION: Continued large cystic area within the pancreatic head, but decreasing in size overall since prior study. Mild stranding around the pancreas. Associated pancreatic ductal and biliary ductal dilatation which is stable since prior study. Given the improvement since previous study, I favor this is related to pancreatitis with pseudocyst in the pancreatic head. Continued follow-up to resolution recommended. Colonic diverticulosis. Fatty liver. Aortic atherosclerosis. Electronically Signed   By: Charlett NoseKevin  Dover M.D.   On: 09/30/2022 14:42    Labs: BNP (last 3 results) No results for input(s): "BNP" in the last 8760 hours. Basic Metabolic Panel: Recent Labs  Lab 10/04/22 0425 10/05/22 0443 10/06/22 0418 10/07/22 0443 10/08/22 0451 10/09/22 0526 10/10/22 0419  NA 135   < > 137 135 132* 137 136  K 3.2*   < > 4.0 3.5 3.3* 3.8 3.8  CL 101   < > 103  101 99 105 107  CO2 26   < > 24 24 23 24 23   GLUCOSE 116*   < > 108* 117* 114* 115* 105*  BUN <5*   < > 6* 6* 12 12 8   CREATININE 0.78   < > 0.86 0.88 1.08 1.08 0.92  CALCIUM 9.0   < > 9.2 8.9 8.7* 9.0 8.5*  MG 1.8  --   --   --   --   --   --    < > = values in this interval not displayed.   Liver Function Tests: Recent Labs  Lab 10/06/22 0418 10/07/22 0443 10/08/22 0451 10/09/22 0526 10/10/22 0419  AST 332* 282* 307* 106* 63*  ALT 375* 364* 382* 281* 196*  ALKPHOS 275* 276* 303* 278* 199*  BILITOT 8.6* 8.0* 8.7* 3.5* 2.7*  PROT 7.3 6.9 6.7 7.3 6.4*  ALBUMIN 3.6 3.2* 3.2* 3.4* 3.1*   No results for input(s): "LIPASE", "AMYLASE" in the last 168 hours. No results for input(s): "AMMONIA" in the last 168 hours. CBC: Recent Labs  Lab 10/04/22 0425 10/10/22 0419  WBC 5.5 5.5  HGB 11.9* 10.5*  HCT 35.8* 32.7*  MCV 95.2 100.0  PLT 450* 685*   Cardiac Enzymes: No results for input(s): "CKTOTAL", "CKMB", "CKMBINDEX", "TROPONINI" in the last 168 hours. BNP: Invalid input(s): "POCBNP" CBG: No results for input(s): "GLUCAP" in the last 168 hours. D-Dimer No results for input(s): "DDIMER" in the last 72 hours. Hgb A1c No results for input(s): "HGBA1C" in the last 72 hours. Lipid Profile No results for input(s): "CHOL", "HDL", "LDLCALC", "TRIG", "CHOLHDL", "LDLDIRECT" in the last 72 hours. Thyroid function  studies No results for input(s): "TSH", "T4TOTAL", "T3FREE", "THYROIDAB" in the last 72 hours.  Invalid input(s): "FREET3" Anemia work up No results for input(s): "VITAMINB12", "FOLATE", "FERRITIN", "TIBC", "IRON", "RETICCTPCT" in the last 72 hours. Urinalysis    Component Value Date/Time   COLORURINE YELLOW 09/30/2022 1223   APPEARANCEUR HAZY (A) 09/30/2022 1223   LABSPEC 1.027 09/30/2022 1223   PHURINE 6.5 09/30/2022 1223   GLUCOSEU NEGATIVE 09/30/2022 1223   HGBUR LARGE (A) 09/30/2022 1223   BILIRUBINUR SMALL (A) 09/30/2022 1223   KETONESUR >80 (A) 09/30/2022  1223   PROTEINUR >300 (A) 09/30/2022 1223   NITRITE NEGATIVE 09/30/2022 1223   LEUKOCYTESUR NEGATIVE 09/30/2022 1223   Sepsis Labs Recent Labs  Lab 10/04/22 0425 10/10/22 0419  WBC 5.5 5.5   Microbiology No results found for this or any previous visit (from the past 240 hour(s)).   Time coordinating discharge:35 minutes  SIGNED: Lanae Boast, MD  [facility] 10/10/2022, 10:41 AM  If 7PM-7AM, please contact night-coverage [email or web address]

## 2022-10-11 ENCOUNTER — Telehealth: Payer: Self-pay

## 2022-10-11 NOTE — Telephone Encounter (Signed)
Transition Care Management Follow-up Telephone Call Date of discharge and from where: [facility] 10/10/2022 How have you been since you were released from the hospital? good Any questions or concerns? no  Items Reviewed: Did the pt receive and understand the discharge instructions provided? Yes  Medications obtained and verified? Yes  Other? No  Any new allergies since your discharge? No  Dietary orders reviewed? Yes Do you have support at home? Yes   Home Care and Equipment/Supplies: Were home health services ordered? no If so, what is the name of the agency? N/a  Has the agency set up a time to come to the patient's home? not applicable Were any new equipment or medical supplies ordered?  No What is the name of the medical supply agency? N/a Were you able to get the supplies/equipment? not applicable Do you have any questions related to the use of the equipment or supplies? No  Functional Questionnaire: (I = Independent and D = Dependent) ADLs: I  Bathing/Dressing- I  Meal Prep- I  Eating- I  Maintaining continence- I  Transferring/Ambulation- I  Managing Meds- I  Follow up appointments reviewed:  PCP Hospital f/u appt confirmed? Yes  Scheduled to see Dr Cherlynn Kaiser on 10/18/2022 @ 2:30. [facility] f/u appt confirmed? No   Are transportation arrangements needed? No  If their condition worsens, is the pt aware to call PCP or go to the Emergency Dept.? Yes Was the patient provided with contact information for the PCP's office or ED? Yes Was to pt encouraged to call back with questions or concerns? Yes Juanda Crumble, LPN [facility] [phone]

## 2022-10-14 ENCOUNTER — Other Ambulatory Visit: Payer: Self-pay | Admitting: Gastroenterology

## 2022-10-14 DIAGNOSIS — K838 Other specified diseases of biliary tract: Secondary | ICD-10-CM

## 2022-10-14 DIAGNOSIS — R945 Abnormal results of liver function studies: Secondary | ICD-10-CM

## 2022-10-18 ENCOUNTER — Ambulatory Visit (INDEPENDENT_AMBULATORY_CARE_PROVIDER_SITE_OTHER): Payer: Commercial Managed Care - HMO | Admitting: Family Medicine

## 2022-10-18 ENCOUNTER — Encounter: Payer: Self-pay | Admitting: Family Medicine

## 2022-10-19 NOTE — Progress Notes (Signed)
Erroneous encounter.  Pt left before being seen

## 2022-11-04 ENCOUNTER — Other Ambulatory Visit: Payer: Self-pay | Admitting: *Deleted

## 2022-11-04 MED ORDER — AMLODIPINE BESYLATE 5 MG PO TABS: 5.0000 mg | ORAL_TABLET | Freq: Every day | ORAL | 3 refills | Status: DC

## 2022-11-04 MED ORDER — FOLIC ACID 1 MG PO TABS: 1.0000 mg | ORAL_TABLET | Freq: Every day | ORAL | 3 refills | Status: DC

## 2022-11-04 MED ORDER — PANTOPRAZOLE SODIUM 40 MG PO TBEC: 40.0000 mg | DELAYED_RELEASE_TABLET | Freq: Every day | ORAL | 3 refills | Status: AC

## 2022-11-08 ENCOUNTER — Encounter: Payer: Self-pay | Admitting: Family Medicine

## 2022-11-08 ENCOUNTER — Ambulatory Visit (INDEPENDENT_AMBULATORY_CARE_PROVIDER_SITE_OTHER): Payer: Commercial Managed Care - HMO | Admitting: Family Medicine

## 2022-11-08 VITALS — BP 130/70 | HR 81 | Temp 98.4°F | Ht 72.0 in | Wt 184.0 lb

## 2022-11-08 DIAGNOSIS — K852 Alcohol induced acute pancreatitis without necrosis or infection: Secondary | ICD-10-CM

## 2022-11-08 DIAGNOSIS — F1010 Alcohol abuse, uncomplicated: Secondary | ICD-10-CM | POA: Diagnosis not present

## 2022-11-08 DIAGNOSIS — N528 Other male erectile dysfunction: Secondary | ICD-10-CM

## 2022-11-08 DIAGNOSIS — R21 Rash and other nonspecific skin eruption: Secondary | ICD-10-CM

## 2022-11-08 DIAGNOSIS — R030 Elevated blood-pressure reading, without diagnosis of hypertension: Secondary | ICD-10-CM | POA: Diagnosis not present

## 2022-11-08 DIAGNOSIS — R7989 Other specified abnormal findings of blood chemistry: Secondary | ICD-10-CM

## 2022-11-08 LAB — CBC WITH DIFFERENTIAL/PLATELET
Basophils Absolute: 0.1 10*3/uL (ref 0.0–0.1)
Basophils Relative: 0.9 % (ref 0.0–3.0)
Eosinophils Absolute: 0.2 10*3/uL (ref 0.0–0.7)
Eosinophils Relative: 2.8 % (ref 0.0–5.0)
HCT: 38.1 % — ABNORMAL LOW (ref 39.0–52.0)
Hemoglobin: 12.5 g/dL — ABNORMAL LOW (ref 13.0–17.0)
Lymphocytes Relative: 29.6 % (ref 12.0–46.0)
Lymphs Abs: 2.1 10*3/uL (ref 0.7–4.0)
MCHC: 33 g/dL (ref 30.0–36.0)
MCV: 93.7 fl (ref 78.0–100.0)
Monocytes Absolute: 0.7 10*3/uL (ref 0.1–1.0)
Monocytes Relative: 9.7 % (ref 3.0–12.0)
Neutro Abs: 4 10*3/uL (ref 1.4–7.7)
Neutrophils Relative %: 57 % (ref 43.0–77.0)
Platelets: 450 10*3/uL — ABNORMAL HIGH (ref 150.0–400.0)
RBC: 4.06 Mil/uL — ABNORMAL LOW (ref 4.22–5.81)
RDW: 14 % (ref 11.5–15.5)
WBC: 6.9 10*3/uL (ref 4.0–10.5)

## 2022-11-08 LAB — COMPREHENSIVE METABOLIC PANEL
ALT: 12 U/L (ref 0–53)
AST: 12 U/L (ref 0–37)
Albumin: 4.2 g/dL (ref 3.5–5.2)
Alkaline Phosphatase: 88 U/L (ref 39–117)
BUN: 12 mg/dL (ref 6–23)
CO2: 27 mEq/L (ref 19–32)
Calcium: 9.4 mg/dL (ref 8.4–10.5)
Chloride: 105 mEq/L (ref 96–112)
Creatinine, Ser: 0.97 mg/dL (ref 0.40–1.50)
GFR: 83.85 mL/min (ref >60.00)
Glucose, Bld: 98 mg/dL (ref 70–99)
Potassium: 4.5 mEq/L (ref 3.5–5.1)
Sodium: 140 mEq/L (ref 135–145)
Total Bilirubin: 0.8 mg/dL (ref 0.2–1.2)
Total Protein: 6.6 g/dL (ref 6.0–8.3)

## 2022-11-08 LAB — AMYLASE: Amylase: 130 U/L (ref 27–131)

## 2022-11-08 LAB — VITAMIN B12: Vitamin B-12: 663 pg/mL (ref 211–911)

## 2022-11-08 LAB — LIPASE: Lipase: 214 U/L — ABNORMAL HIGH (ref 11.0–59.0)

## 2022-11-08 MED ORDER — TADALAFIL 10 MG PO TABS: 10.0000 mg | ORAL_TABLET | Freq: Every day | ORAL | 3 refills | Status: DC | PRN

## 2022-11-08 MED ORDER — KETOCONAZOLE 2 % EX SHAM: MEDICATED_SHAMPOO | CUTANEOUS | 3 refills | Status: DC

## 2022-11-08 MED ORDER — KETOCONAZOLE 2 % EX CREA: 1.0000 | TOPICAL_CREAM | Freq: Two times a day (BID) | CUTANEOUS | 2 refills | Status: DC

## 2022-11-08 NOTE — Patient Instructions (Signed)
It was very nice to see you today!  Happy Holidays!   PLEASE NOTE:  If you had any lab tests please let us know if you have not heard back within a few days. You may see your results on MyChart before we have a chance to review them but we will give you a call once they are reviewed by us. If we ordered any referrals today, please let us know if you have not heard from their office within the next week.   Please try these tips to maintain a healthy lifestyle:  Eat most of your calories during the day when you are active. Eliminate processed foods including packaged sweets (pies, cakes, cookies), reduce intake of potatoes, white bread, white pasta, and white rice. Look for whole grain options, oat flour or almond flour.  Each meal should contain half fruits/vegetables, one quarter protein, and one quarter carbs (no bigger than a computer mouse).  Cut down on sweet beverages. This includes juice, soda, and sweet tea. Also watch fruit intake, though this is a healthier sweet option, it still contains natural sugar! Limit to 3 servings daily.  Drink at least 1 glass of water with each meal and aim for at least 8 glasses per day  Exercise at least 150 minutes every week.   

## 2022-11-08 NOTE — Progress Notes (Signed)
Subjective:     Patient ID: Jeremy Ewing, male    DOB: [birth year], 61 y.o.   MRN: [id number]  Chief Complaint  Patient presents with   Hospitalization Follow-up    Discuss medication    HPI F/u hosp for ETOH pancreatitis-no liquor but had 2 beers. None now. Has Ct sch on Friday. Then will get appt from GI per pt. Seeing [facility].  Did labs in f/u 2.  HTN-not taking meds. Doing well.  No cp/sob.    Health Maintenance Due  Topic Date Due   COLONOSCOPY (Pts 45-32yrs Insurance coverage will need to be confirmed)  Never done    Past Medical History:  Diagnosis Date   GERD (gastroesophageal reflux disease)    Pancreatitis     Past Surgical History:  Procedure Laterality Date   BILIARY STENT PLACEMENT N/A 10/09/2022   Procedure: BILIARY STENT PLACEMENT;  Surgeon: Kerin Salen, MD;  Location: [facility];  Service: Gastroenterology;  Laterality: N/A;   ERCP N/A 10/09/2022   Procedure: ENDOSCOPIC RETROGRADE CHOLANGIOPANCREATOGRAPHY (ERCP);  Surgeon: Kerin Salen, MD;  Location: [facility];  Service: Gastroenterology;  Laterality: N/A;   IR RADIOLOGIST EVAL & MGMT  09/20/2019   IR RADIOLOGIST EVAL & MGMT  10/04/2019   LEG SURGERY Left 1998   pin hip/ femur and tibia   REMOVAL OF STONES  10/09/2022   Procedure: REMOVAL OF STONES;  Surgeon: Kerin Salen, MD;  Location: [facility];  Service: Gastroenterology;;   Dennison Mascot  10/09/2022   Procedure: SPHINCTEROTOMY;  Surgeon: Kerin Salen, MD;  Location: [facility];  Service: Gastroenterology;;   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 90 tablet 3   ketoconazole (NIZORAL) 2 % shampoo Apply topically 2 (two) times a week.     pantoprazole (PROTONIX) 40 MG tablet Take 1 tablet (40 mg total) by mouth daily. 90 tablet 3   thiamine  (VITAMIN B-1) 100 MG tablet Take 1 tablet (100 mg total) by mouth daily. 30 tablet 0   amLODipine (NORVASC) 5 MG tablet Take 1 tablet (5 mg total) by mouth daily. 90 tablet 3   clotrimazole-betamethasone (LOTRISONE) cream Apply 1 Application topically 2 (two) times daily.     No facility-administered medications prior to visit.    No Known Allergies ROS neg/noncontributory except as noted HPI/below  Some Ed-tried meds in past. Has trouble getting/maintaining.       Objective:     BP 130/70   Pulse 81   Temp 98.4 F (36.9 C) (Temporal)   Ht 6' (1.829 m)   Wt 184 lb (83.5 kg)   SpO2 98%   BMI 24.95 kg/m  Wt Readings from Last 3 Encounters:  11/08/22 184 lb (83.5 kg)  09/30/22 176 lb 9.4 oz (80.1 kg)  09/30/22 176 lb 9.6 oz (80.1 kg)    Physical Exam   Gen: WDWN NAD HEENT: NCAT, conjunctiva not injected, sclera nonicteric NECK:  supple, no thyromegaly, no nodes, no carotid bruits CARDIAC: RRR, S1S2+, no murmur. DP 2+B LUNGS: CTAB. No wheezes ABDOMEN:  BS+, soft, NTND, No HSM, no masses EXT:  no edema MSK: no gross abnormalities.  NEURO: A&O x3.  CN II-XII intact.  PSYCH: normal mood. Good eye contact     Assessment & Plan:   Problem  List Items Addressed This Visit       Digestive   Acute pancreatitis - Primary     Other   ETOH abuse   Elevated LFTs   Other male erectile dysfunction   Other Visit Diagnoses     Elevated blood pressure reading          Pancreatitis-was acute from ETOH.  Has stent.  Was hosp.  Follows up w/Ct this wk and [facility].  Stay off ETOH. Check amylase/lipase Elevated LFT-from ETOH-off for 1 mo.  Check cmp, cbc Elevated bp-was more from DT's/pain/ETOH-normal now.  Monitor ED-trial Cialis 10mg -discussed how to take, SED.  Rash-fungal-cont keto shampoo and cream.  No orders of the defined types were placed in this encounter.   , MD

## 2022-11-08 NOTE — Progress Notes (Signed)
Looking much better.  Continue supplements as you are and keep up the great work!

## 2022-11-09 ENCOUNTER — Telehealth: Payer: Self-pay | Admitting: Family Medicine

## 2022-11-09 NOTE — Telephone Encounter (Signed)
Caller States: -She is a Financial risk analyst with [employer] -Patient is ready to be discharged from care. -She is providing contact information PCP team needs any follow up information.   No further follow up requested, at this time.

## 2022-11-09 NOTE — Telephone Encounter (Signed)
FYI

## 2022-11-12 ENCOUNTER — Other Ambulatory Visit: Payer: Commercial Managed Care - HMO

## 2022-11-16 MED ORDER — KETOCONAZOLE 2 % EX SHAM: MEDICATED_SHAMPOO | CUTANEOUS | 3 refills | Status: DC

## 2022-11-16 NOTE — Addendum Note (Signed)
Addended by: Jobe Gibbon on: 11/16/2022 12:35 PM   Modules accepted: Orders

## 2022-11-18 ENCOUNTER — Other Ambulatory Visit (HOSPITAL_COMMUNITY): Payer: Self-pay | Admitting: Gastroenterology

## 2022-11-18 DIAGNOSIS — K838 Other specified diseases of biliary tract: Secondary | ICD-10-CM

## 2022-11-18 DIAGNOSIS — R7989 Other specified abnormal findings of blood chemistry: Secondary | ICD-10-CM

## 2022-11-26 ENCOUNTER — Ambulatory Visit (HOSPITAL_COMMUNITY)
Admission: RE | Admit: 2022-11-26 | Discharge: 2022-11-26 | Disposition: A | Discharge status: Home / Self Care | Payer: Commercial Managed Care - HMO | Source: Ambulatory Visit | Attending: Gastroenterology | Admitting: Gastroenterology

## 2022-11-26 DIAGNOSIS — K838 Other specified diseases of biliary tract: Secondary | ICD-10-CM | POA: Diagnosis present

## 2022-11-26 DIAGNOSIS — R7989 Other specified abnormal findings of blood chemistry: Secondary | ICD-10-CM | POA: Diagnosis present

## 2022-11-26 MED ORDER — IOHEXOL 300 MG/ML  SOLN
100.0000 mL | Freq: Once | INTRAMUSCULAR | Status: AC | PRN
  Administered 2022-11-26: 100 mL via INTRAVENOUS

## 2022-11-26 MED ORDER — SODIUM CHLORIDE (PF) 0.9 % IJ SOLN
INTRAMUSCULAR | Status: AC
  Filled 2022-11-26: qty 50

## 2022-12-17 ENCOUNTER — Encounter: Payer: Self-pay | Admitting: Family Medicine

## 2022-12-17 ENCOUNTER — Ambulatory Visit (INDEPENDENT_AMBULATORY_CARE_PROVIDER_SITE_OTHER): Payer: 59 | Admitting: Family Medicine

## 2022-12-17 ENCOUNTER — Telehealth: Payer: Self-pay | Admitting: Family Medicine

## 2022-12-17 VITALS — BP 120/80 | HR 74 | Temp 98.2°F | Ht 72.0 in | Wt 192.0 lb

## 2022-12-17 DIAGNOSIS — R7989 Other specified abnormal findings of blood chemistry: Secondary | ICD-10-CM

## 2022-12-17 DIAGNOSIS — K863 Pseudocyst of pancreas: Secondary | ICD-10-CM

## 2022-12-17 LAB — CBC WITH DIFFERENTIAL/PLATELET
Basophils Absolute: 0 10*3/uL (ref 0.0–0.1)
Basophils Relative: 0.8 % (ref 0.0–3.0)
Eosinophils Absolute: 0.1 10*3/uL (ref 0.0–0.7)
Eosinophils Relative: 1.9 % (ref 0.0–5.0)
HCT: 38.6 % — ABNORMAL LOW (ref 39.0–52.0)
Hemoglobin: 12.8 g/dL — ABNORMAL LOW (ref 13.0–17.0)
Lymphocytes Relative: 38 % (ref 12.0–46.0)
Lymphs Abs: 1.9 10*3/uL (ref 0.7–4.0)
MCHC: 33.1 g/dL (ref 30.0–36.0)
MCV: 87.4 fl (ref 78.0–100.0)
Monocytes Absolute: 0.5 10*3/uL (ref 0.1–1.0)
Monocytes Relative: 9.3 % (ref 3.0–12.0)
Neutro Abs: 2.5 10*3/uL (ref 1.4–7.7)
Neutrophils Relative %: 50 % (ref 43.0–77.0)
Platelets: 396 10*3/uL (ref 150.0–400.0)
RBC: 4.41 Mil/uL (ref 4.22–5.81)
RDW: 14.8 % (ref 11.5–15.5)
WBC: 5.1 10*3/uL (ref 4.0–10.5)

## 2022-12-17 LAB — COMPREHENSIVE METABOLIC PANEL
ALT: 16 U/L (ref 0–53)
AST: 16 U/L (ref 0–37)
Albumin: 4.2 g/dL (ref 3.5–5.2)
Alkaline Phosphatase: 82 U/L (ref 39–117)
BUN: 16 mg/dL (ref 6–23)
CO2: 27 mEq/L (ref 19–32)
Calcium: 9.3 mg/dL (ref 8.4–10.5)
Chloride: 105 mEq/L (ref 96–112)
Creatinine, Ser: 1.17 mg/dL (ref 0.40–1.50)
GFR: 66.91 mL/min (ref >60.00)
Glucose, Bld: 109 mg/dL — ABNORMAL HIGH (ref 70–99)
Potassium: 4.6 mEq/L (ref 3.5–5.1)
Sodium: 139 mEq/L (ref 135–145)
Total Bilirubin: 0.5 mg/dL (ref 0.2–1.2)
Total Protein: 6.5 g/dL (ref 6.0–8.3)

## 2022-12-17 LAB — LIPASE: Lipase: 8 U/L — ABNORMAL LOW (ref 11.0–59.0)

## 2022-12-17 NOTE — Progress Notes (Signed)
Subjective:     Patient ID: Jeremy Ewing, male    DOB: Nov 12, 1961, 62 y.o.   MRN: [id number]  Chief Complaint  Patient presents with   Discuss GI visit    Discuss GI visit and CT scan results    HPI Saw GI-wanted to put him on Creon-per pt $5000.  Did CT on 12/22.  Regular BM, no digestive issues.  Pancrealipase.    No more ETOH-pt feels great.  Would like to check labs. Taking the folic acid, R44, Thiamine.  Will see GI in 2 mo.  Also due for colon he thinks-he will get copy.  Health Maintenance Due  Topic Date Due   COLONOSCOPY (Pts 45-25yrs Insurance coverage will need to be confirmed)  Never done    Past Medical History:  Diagnosis Date   GERD (gastroesophageal reflux disease)    Pancreatitis     Past Surgical History:  Procedure Laterality Date   BILIARY STENT PLACEMENT N/A 10/09/2022   Procedure: BILIARY STENT PLACEMENT;  Surgeon: Ronnette Juniper, MD;  Location: [facility];  Service: Gastroenterology;  Laterality: N/A;   ERCP N/A 10/09/2022   Procedure: ENDOSCOPIC RETROGRADE CHOLANGIOPANCREATOGRAPHY (ERCP);  Surgeon: Ronnette Juniper, MD;  Location: [facility];  Service: Gastroenterology;  Laterality: N/A;   IR RADIOLOGIST EVAL & MGMT  09/20/2019   IR RADIOLOGIST EVAL & MGMT  10/04/2019   LEG SURGERY Left 1998   pin hip/ femur and tibia   REMOVAL OF STONES  10/09/2022   Procedure: REMOVAL OF STONES;  Surgeon: Ronnette Juniper, MD;  Location: [facility];  Service: Gastroenterology;;   Joan Mayans  10/09/2022   Procedure: SPHINCTEROTOMY;  Surgeon: Ronnette Juniper, MD;  Location: [facility];  Service: Gastroenterology;;   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 90 tablet 3   ketoconazole (NIZORAL) 2 % cream Apply 1 Application topically 2 (two) times daily. 60 g 2   ketoconazole  (NIZORAL) 2 % shampoo Apply topically 2 (two) times a week. 120 mL 3   omeprazole (PRILOSEC) 40 MG capsule Take 40 mg by mouth daily.     tadalafil (CIALIS) 10 MG tablet Take 1 tablet (10 mg total) by mouth daily as needed for erectile dysfunction. 10 tablet 3   pantoprazole (PROTONIX) 40 MG tablet Take 1 tablet (40 mg total) by mouth daily. (Patient not taking: Reported on 12/17/2022) 90 tablet 3   No facility-administered medications prior to visit.    No Known Allergies ROS neg/noncontributory except as noted HPI/below      Objective:     BP 120/80   Pulse 74   Temp 98.2 F (36.8 C) (Temporal)   Ht 6' (1.829 m)   Wt 192 lb (87.1 kg)   SpO2 99%   BMI 26.04 kg/m  Wt Readings from Last 3 Encounters:  12/17/22 192 lb (87.1 kg)  11/08/22 184 lb (83.5 kg)  09/30/22 176 lb 9.4 oz (80.1 kg)    Physical Exam   Gen: WDWN NAD HEENT: NCAT, conjunctiva not injected, sclera nonicteric NECK:  supple, no thyromegaly, no nodes, no carotid bruits CARDIAC: RRR, S1S2+, no murmur. DP 2+B LUNGS: CTAB. No wheezes ABDOMEN:  BS+, soft, NTND, No HSM, no masses EXT:  no edema MSK: no gross abnormalities.  NEURO: A&O x3.  CN II-XII  intact.  PSYCH: normal mood. Good eye contact  Reviewed CT report 12/22     Assessment & Plan:   Problem List Items Addressed This Visit       Digestive   Pancreatic pseudocyst - Primary     Other   Elevated LFTs   Pancreatic pseudocyst-improving.  Pt off ETOH and advised to stay off for life-sch f/u w/GI.  Check cbc,lipase.  Advised I can't advise on pancreatic enzymes. Elevated LFT's-improving after stent and off ETOH-check cbc cmp.   F/u 3 mo  No orders of the defined types were placed in this encounter.   Wellington Hampshire, MD

## 2022-12-17 NOTE — Patient Instructions (Addendum)
It was very nice to see you today!  Keep up the great work.  Get a copy of colonoscopy and schedule with GI   PLEASE NOTE:  If you had any lab tests please let us know if you have not heard back within a few days. You may see your results on MyChart before we have a chance to review them but we will give you a call once they are reviewed by [place]. If we ordered any referrals today, please let us know if you have not heard from their office within the next week.   Please try these tips to maintain a healthy lifestyle:  Eat most of your calories during the day when you are active. Eliminate processed foods including packaged sweets (pies, cakes, cookies), reduce intake of potatoes, white bread, white pasta, and white rice. Look for whole grain options, oat flour or almond flour.  Each meal should contain half fruits/vegetables, one quarter protein, and one quarter carbs (no bigger than a computer mouse).  Cut down on sweet beverages. This includes juice, soda, and sweet tea. Also watch fruit intake, though this is a healthier sweet option, it still contains natural sugar! Limit to 3 servings daily.  Drink at least 1 glass of water with each meal and aim for at least 8 glasses per day  Exercise at least 150 minutes every week.

## 2022-12-17 NOTE — Telephone Encounter (Signed)
Patient states: - PCP wanted him to find out when last colonoscopy was  - He found out via [facility] that his last colonoscopy was 04/05/2014

## 2022-12-17 NOTE — Telephone Encounter (Signed)
Please see message below

## 2022-12-17 NOTE — Progress Notes (Signed)
Labs look great!  Still slight anemia but improving.  Will continue to monitor.

## 2023-01-29 ENCOUNTER — Other Ambulatory Visit: Payer: Self-pay | Admitting: Family Medicine

## 2024-02-15 DIAGNOSIS — Z419 Encounter for procedure for purposes other than remedying health state, unspecified: Secondary | ICD-10-CM | POA: Diagnosis not present

## 2024-03-17 DIAGNOSIS — Z419 Encounter for procedure for purposes other than remedying health state, unspecified: Secondary | ICD-10-CM | POA: Diagnosis not present

## 2024-04-16 DIAGNOSIS — Z419 Encounter for procedure for purposes other than remedying health state, unspecified: Secondary | ICD-10-CM | POA: Diagnosis not present

## 2024-05-17 DIAGNOSIS — Z419 Encounter for procedure for purposes other than remedying health state, unspecified: Secondary | ICD-10-CM | POA: Diagnosis not present

## 2024-06-16 DIAGNOSIS — Z419 Encounter for procedure for purposes other than remedying health state, unspecified: Secondary | ICD-10-CM | POA: Diagnosis not present

## 2024-07-17 DIAGNOSIS — Z419 Encounter for procedure for purposes other than remedying health state, unspecified: Secondary | ICD-10-CM | POA: Diagnosis not present

## 2024-08-17 DIAGNOSIS — Z419 Encounter for procedure for purposes other than remedying health state, unspecified: Secondary | ICD-10-CM | POA: Diagnosis not present

## 2024-11-16 DIAGNOSIS — Z419 Encounter for procedure for purposes other than remedying health state, unspecified: Secondary | ICD-10-CM | POA: Diagnosis not present

## 2024-11-30 ENCOUNTER — Ambulatory Visit: Admitting: Family Medicine

## 2024-12-04 ENCOUNTER — Ambulatory Visit: Admitting: Family Medicine

## 2024-12-07 ENCOUNTER — Encounter: Payer: Self-pay | Admitting: Family Medicine

## 2024-12-13 ENCOUNTER — Ambulatory Visit: Admitting: Family Medicine

## 2024-12-13 ENCOUNTER — Encounter: Payer: Self-pay | Admitting: Family Medicine

## 2024-12-13 ENCOUNTER — Telehealth: Payer: Self-pay

## 2024-12-13 VITALS — BP 118/84 | HR 78 | Temp 97.9°F | Ht 72.0 in | Wt 186.0 lb

## 2024-12-13 DIAGNOSIS — Z125 Encounter for screening for malignant neoplasm of prostate: Secondary | ICD-10-CM | POA: Diagnosis not present

## 2024-12-13 DIAGNOSIS — R29898 Other symptoms and signs involving the musculoskeletal system: Secondary | ICD-10-CM | POA: Diagnosis not present

## 2024-12-13 DIAGNOSIS — F1010 Alcohol abuse, uncomplicated: Secondary | ICD-10-CM

## 2024-12-13 LAB — CBC WITH DIFFERENTIAL/PLATELET
Basophils Absolute: 0 K/uL (ref 0.0–0.1)
Basophils Relative: 0.6 % (ref 0.0–3.0)
Eosinophils Absolute: 0 K/uL (ref 0.0–0.7)
Eosinophils Relative: 0.5 % (ref 0.0–5.0)
HCT: 37.6 % — ABNORMAL LOW (ref 39.0–52.0)
Hemoglobin: 12.4 g/dL — ABNORMAL LOW (ref 13.0–17.0)
Lymphocytes Relative: 26.7 % (ref 12.0–46.0)
Lymphs Abs: 1.1 K/uL (ref 0.7–4.0)
MCHC: 33.1 g/dL (ref 30.0–36.0)
MCV: 98.9 fl (ref 78.0–100.0)
Monocytes Absolute: 0.8 K/uL (ref 0.1–1.0)
Monocytes Relative: 18.1 % — ABNORMAL HIGH (ref 3.0–12.0)
Neutro Abs: 2.3 K/uL (ref 1.4–7.7)
Neutrophils Relative %: 54.1 % (ref 43.0–77.0)
Platelets: 145 K/uL — ABNORMAL LOW (ref 150.0–400.0)
RBC: 3.8 Mil/uL — ABNORMAL LOW (ref 4.22–5.81)
RDW: 14.4 % (ref 11.5–15.5)
WBC: 4.2 K/uL (ref 4.0–10.5)

## 2024-12-13 LAB — FOLATE: Folate: >23.7 ng/mL

## 2024-12-13 LAB — COMPREHENSIVE METABOLIC PANEL WITH GFR
ALT: 57 U/L — ABNORMAL HIGH (ref 3–53)
AST: 73 U/L — ABNORMAL HIGH (ref 5–37)
Albumin: 5.1 g/dL (ref 3.5–5.2)
Alkaline Phosphatase: 92 U/L (ref 39–117)
BUN: 24 mg/dL — ABNORMAL HIGH (ref 6–23)
CO2: 22 meq/L (ref 19–32)
Calcium: 9.5 mg/dL (ref 8.4–10.5)
Chloride: 96 meq/L (ref 96–112)
Creatinine, Ser: 1.21 mg/dL (ref 0.40–1.50)
GFR: 63.37 mL/min
Glucose, Bld: 92 mg/dL (ref 70–99)
Potassium: 4.1 meq/L (ref 3.5–5.1)
Sodium: 135 meq/L (ref 135–145)
Total Bilirubin: 1.4 mg/dL — ABNORMAL HIGH (ref 0.2–1.2)
Total Protein: 7.5 g/dL (ref 6.0–8.3)

## 2024-12-13 LAB — CK: Total CK: 740 U/L — ABNORMAL HIGH (ref 17–232)

## 2024-12-13 LAB — TSH: TSH: 1.1 u[IU]/mL (ref 0.35–5.50)

## 2024-12-13 LAB — C-REACTIVE PROTEIN: CRP: <0.5 mg/dL — ABNORMAL LOW (ref 1.0–20.0)

## 2024-12-13 LAB — VITAMIN B12: Vitamin B-12: 547 pg/mL (ref 211–911)

## 2024-12-13 LAB — MAGNESIUM: Magnesium: 1.8 mg/dL (ref 1.5–2.5)

## 2024-12-13 LAB — SEDIMENTATION RATE: Sed Rate: 10 mm/h (ref 0–20)

## 2024-12-13 NOTE — Progress Notes (Addendum)
 "  Subjective:     Patient ID: Jeremy Ewing, male    DOB: 05/22/1961, 64 y.o.   MRN: [id number]  Chief Complaint  Patient presents with   Leg Pain    Patient explained that his legs tend to get stiff at times and when that happens he can hardly walk. There is no pain when the stiffness occurs but he thinks it could be arthritis.    Discussed the use of AI scribe software for clinical note transcription with the patient, who gave verbal consent to proceed.  History of Present Illness Jeremy Ewing is a 64 year old male who presents with stiffness and difficulty moving.  He has been experiencing stiffness and difficulty moving for a couple of years, primarily affecting his legs, particularly the calves. The stiffness is described as soreness rather than pain and is intermittent, lasting from a few days to a week before resolving and then recurring. He sometimes cannot get up from a chair and has had to crawl to the bathroom. He recently purchased a cane but has not used it yet.  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 He attributes his difficulty moving to 'arthritis' and reports that his neighbors have commented on seeing him struggle to move. He sometimes has a hard time getting out of bed and often sleeps on the couch.  For symptom relief, he uses Tylenol  and a Salonpas patch, which provide temporary relief. He is not currently taking any prescribed medications for this issue.  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 He has four grandchildren.  No new surgeries, hospital visits, chest pain, shortness of breath, vomiting, or diarrhea. He reports stiffness and soreness in his legs, particularly the calves, but no pain. He also denies any abdominal pain.    Health Maintenance Due  Topic Date Due   Colonoscopy  04/05/2024    Past  Medical History:  Diagnosis Date   GERD (gastroesophageal reflux disease)    Pancreatitis     Past Surgical History:  Procedure Laterality Date   BILIARY STENT PLACEMENT N/A 10/09/2022   Procedure: BILIARY STENT PLACEMENT;  Surgeon: Saintclair Jasper, MD;  Location: [facility];  Service: Gastroenterology;  Laterality: N/A;   ERCP N/A 10/09/2022   Procedure: ENDOSCOPIC RETROGRADE CHOLANGIOPANCREATOGRAPHY (ERCP);  Surgeon: Saintclair Jasper, MD;  Location: [facility];  Service: Gastroenterology;  Laterality: N/A;   IR RADIOLOGIST EVAL & MGMT  09/20/2019   IR RADIOLOGIST EVAL & MGMT  10/04/2019   LEG SURGERY Left 1998   pin hip/ femur and tibia   REMOVAL OF STONES  10/09/2022   Procedure: REMOVAL OF STONES;  Surgeon: Saintclair Jasper, MD;  Location: [facility];  Service: Gastroenterology;;   ANNETT  10/09/2022   Procedure: SPHINCTEROTOMY;  Surgeon: Saintclair Jasper, MD;  Location: [facility];  Service: Gastroenterology;;   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 Smells mildly of ketones HEAD EYES EARS NOSE THROAT: Normocephalic, atraumatic, conjunctiva not injected, sclera nonicteric. CARDIAC: Regular rate and rhythm, S1 S2 present, no murmur. NECK: Supple, no thyromegaly, no nodes, no carotid bruits. LUNGS: Clear to auscultation bilaterally, no wheezes. ABDOMEN: Bowel sounds present, soft, non-tender, non-distended, no hepatosplenomegaly, no masses. EXTREMITIES: No edema. MUSCULOSKELETAL: gait a little off, wider base. NEUROLOGICAL: Alert  and oriented x3, cranial nerves II through XII intact, normal finger-to-nose test, normal motor strength in legs and thighs, normal rapid alternating movements, no cogwheel rigidity, some fine tremor hands PSYCHIATRIC: Normal mood, good eye contact.       Assessment & Plan:  Weakness of both legs -     CBC with Differential/Platelet -     Comprehensive metabolic panel with GFR -     TSH -     Vitamin B12 -     Sedimentation rate -     Rheumatoid factor -     C-reactive protein -     CK -     Vitamin B1 -     Folate -     Magnesium  ETOH abuse -     CBC with Differential/Platelet -     Comprehensive metabolic panel with GFR -     TSH -     Vitamin B12 -     Sedimentation rate -     Rheumatoid factor -     C-reactive protein -     CK -     Vitamin B1 -     Folate -     Magnesium    Assessment and Plan Assessment & Plan Leg weakness and stiffness   Chronic leg weakness and stiffness have persisted for a couple of years, primarily affecting the legs with episodes lasting from a few days to a week. There is no significant pain, but soreness is present in the calves. Differential diagnosis includes polymyalgia rheumatica (PMR), arthritis, long term alcohol use, and vitamin deficiencies due to alcohol use, other. No current medications are causing symptoms. Blood work is ordered to rule out PMR and other deficiencies. He is advised to use Voltaren gel for soreness and start a multivitamin. Reassessment will occur after blood work results are available.  Alcohol abuse   Chronic alcohol use involves consuming approximately four shots per day, potentially leading to vitamin deficiencies such as thiamine  and B12. He is advised to reduce alcohol consumption and start a multivitamin to address potential deficiencies.   Addendum 12/14/24-cpk elevated.  Adding more labs  Return in about 1 week (around 12/20/2024) for chronic follow-up.  Jenkins CHRISTELLA Carrel, MD     [1]  Current Outpatient  Medications:    acetaminophen  (TYLENOL ) 500 MG tablet, , Disp: , Rfl:    pantoprazole  (PROTONIX ) 40 MG tablet, Take 1 tablet (40 mg total) by mouth daily., Disp: 90 tablet, Rfl: 3 [2] No Known Allergies  "

## 2024-12-13 NOTE — Telephone Encounter (Signed)
 Copied from CRM [id number]. Topic: Clinical - Medication Question >> Dec 13, 2024  3:04 PM Rea C wrote: Reason for CRM: Voltaren gel  Patient stated that he called in to the phamracy and they did not receive script for Voltaren gel yet. Patient was seen by provider today.    [facility] [address], [place] - [address] [place] [address] Phone: [phone] Fax: [phone] Hours: Not open 24 hours

## 2024-12-13 NOTE — Patient Instructions (Addendum)
 It was very nice to see you today! Some tylenol .   Voltaren gel  Get multivitamin    PLEASE NOTE:  If you had any lab tests please let us  know if you have not heard back within a few days. You may see your results on MyChart before we have a chance to review them but we will give you a call once they are reviewed by us . If we ordered any referrals today, please let us  know if you have not heard from their office within the next week.   Please try these tips to maintain a healthy lifestyle:  Eat most of your calories during the day when you are active. Eliminate processed foods including packaged sweets (pies, cakes, cookies), reduce intake of potatoes, white bread, white pasta, and white rice. Look for whole grain options, oat flour or almond flour.  Each meal should contain half fruits/vegetables, one quarter protein, and one quarter carbs (no bigger than a computer mouse).  Cut down on sweet beverages. This includes juice, soda, and sweet tea. Also watch fruit intake, though this is a healthier sweet option, it still contains natural sugar! Limit to 3 servings daily.  Drink at least 1 glass of water with each meal and aim for at least 8 glasses per day  Exercise at least 150 minutes every week.

## 2024-12-14 ENCOUNTER — Other Ambulatory Visit (INDEPENDENT_AMBULATORY_CARE_PROVIDER_SITE_OTHER)

## 2024-12-14 ENCOUNTER — Other Ambulatory Visit: Payer: Self-pay | Admitting: *Deleted

## 2024-12-14 ENCOUNTER — Ambulatory Visit: Payer: Self-pay | Admitting: Family Medicine

## 2024-12-14 DIAGNOSIS — Z125 Encounter for screening for malignant neoplasm of prostate: Secondary | ICD-10-CM | POA: Diagnosis not present

## 2024-12-14 DIAGNOSIS — R29898 Other symptoms and signs involving the musculoskeletal system: Secondary | ICD-10-CM | POA: Diagnosis not present

## 2024-12-14 LAB — VITAMIN D 25 HYDROXY (VIT D DEFICIENCY, FRACTURES): VITD: 8.11 ng/mL — ABNORMAL LOW (ref 30.00–100.00)

## 2024-12-14 LAB — PSA: PSA: 9.86 ng/mL — ABNORMAL HIGH (ref 0.10–4.00)

## 2024-12-14 NOTE — Addendum Note (Signed)
 Addended by: WENDOLYN JENKINS HERO on: 12/14/2024 07:53 AM   Modules accepted: Orders

## 2024-12-14 NOTE — Progress Notes (Signed)
 Called pt and notified and sent lab in

## 2024-12-14 NOTE — Progress Notes (Signed)
 Muscle enzymes very elevated.  Make sure drinks a lot of water, eating food.  Will see if lab can add more labs and will recheck when here next week.  Need to stop drinking alcohol

## 2024-12-16 ENCOUNTER — Ambulatory Visit: Payer: Self-pay | Admitting: Family Medicine

## 2024-12-16 NOTE — Progress Notes (Signed)
 Still some labs pending Vitamin D  very low-send in 50,000iu/week #12/1 PSA is elevated-we will repeat when here on Wednesday

## 2024-12-17 ENCOUNTER — Other Ambulatory Visit: Payer: Self-pay | Admitting: Family Medicine

## 2024-12-17 ENCOUNTER — Telehealth: Payer: Self-pay

## 2024-12-17 LAB — RHEUMATOID FACTOR: Rheumatoid fact SerPl-aCnc: <10 [IU]/mL

## 2024-12-17 LAB — VITAMIN B1: Vitamin B1 (Thiamine): <6 nmol/L — ABNORMAL LOW (ref 8–30)

## 2024-12-17 MED ORDER — VITAMIN B-1 100 MG PO TABS: 100.0000 mg | ORAL_TABLET | Freq: Every day | ORAL | 3 refills | Status: AC

## 2024-12-17 MED ORDER — VITAMIN D (ERGOCALCIFEROL) 1.25 MG (50000 UNIT) PO CAPS: 50000.0000 [IU] | ORAL_CAPSULE | ORAL | 1 refills | Status: AC

## 2024-12-17 NOTE — Addendum Note (Signed)
 Addended by: Keyandra Swenson, CREE A on: 12/17/2024 03:21 PM   Modules accepted: Orders

## 2024-12-17 NOTE — Progress Notes (Signed)
 Thiamine  VERY low as well-I sent to the pharm

## 2024-12-17 NOTE — Telephone Encounter (Signed)
 Copied from CRM [id number]. Topic: Clinical - Lab/Test Results >> Dec 17, 2024  2:38 PM Delon DASEN wrote: Reason for CRM: returning call about labs- [phone]   Called pt left message

## 2024-12-18 ENCOUNTER — Telehealth: Payer: Self-pay

## 2024-12-18 LAB — ALDOLASE

## 2024-12-18 LAB — ANTI-NUCLEAR AB-TITER (ANA TITER): ANA Titer 1: 1:80 {titer} — ABNORMAL HIGH

## 2024-12-18 LAB — LACTATE DEHYDROGENASE: LDH: 265 U/L — ABNORMAL HIGH (ref 120–250)

## 2024-12-18 LAB — ANA: Anti Nuclear Antibody (ANA): POSITIVE — AB

## 2024-12-18 NOTE — Telephone Encounter (Signed)
 Copied from CRM #[id number]. Topic: Clinical - Lab/Test Results >> Dec 18, 2024  8:23 AM Jeremy Ewing wrote: Reason for CRM: patient has an appt tomorrow and mentioned he can wait until then, please advise [phone]

## 2024-12-18 NOTE — Progress Notes (Signed)
 Called pt and notified smk

## 2024-12-19 ENCOUNTER — Ambulatory Visit: Payer: Self-pay | Admitting: Family Medicine

## 2024-12-19 ENCOUNTER — Encounter: Payer: Self-pay | Admitting: Family Medicine

## 2024-12-19 ENCOUNTER — Ambulatory Visit: Admitting: Family Medicine

## 2024-12-19 VITALS — BP 130/76 | HR 82 | Temp 97.6°F | Ht 72.0 in | Wt 193.1 lb

## 2024-12-19 DIAGNOSIS — K863 Pseudocyst of pancreas: Secondary | ICD-10-CM

## 2024-12-19 DIAGNOSIS — Z1212 Encounter for screening for malignant neoplasm of rectum: Secondary | ICD-10-CM

## 2024-12-19 DIAGNOSIS — Z1211 Encounter for screening for malignant neoplasm of colon: Secondary | ICD-10-CM

## 2024-12-19 DIAGNOSIS — R972 Elevated prostate specific antigen [PSA]: Secondary | ICD-10-CM

## 2024-12-19 DIAGNOSIS — E559 Vitamin D deficiency, unspecified: Secondary | ICD-10-CM | POA: Diagnosis not present

## 2024-12-19 DIAGNOSIS — R7989 Other specified abnormal findings of blood chemistry: Secondary | ICD-10-CM

## 2024-12-19 DIAGNOSIS — R29898 Other symptoms and signs involving the musculoskeletal system: Secondary | ICD-10-CM

## 2024-12-19 DIAGNOSIS — F1010 Alcohol abuse, uncomplicated: Secondary | ICD-10-CM

## 2024-12-19 LAB — COMPREHENSIVE METABOLIC PANEL WITH GFR
ALT: 53 U/L (ref 3–53)
AST: 62 U/L — ABNORMAL HIGH (ref 5–37)
Albumin: 4.3 g/dL (ref 3.5–5.2)
Alkaline Phosphatase: 87 U/L (ref 39–117)
BUN: 9 mg/dL (ref 6–23)
CO2: 24 meq/L (ref 19–32)
Calcium: 8.9 mg/dL (ref 8.4–10.5)
Chloride: 109 meq/L (ref 96–112)
Creatinine, Ser: 0.86 mg/dL (ref 0.40–1.50)
GFR: 91.63 mL/min
Glucose, Bld: 178 mg/dL — ABNORMAL HIGH (ref 70–99)
Potassium: 4.1 meq/L (ref 3.5–5.1)
Sodium: 144 meq/L (ref 135–145)
Total Bilirubin: 0.3 mg/dL (ref 0.2–1.2)
Total Protein: 6.5 g/dL (ref 6.0–8.3)

## 2024-12-19 LAB — CK: Total CK: 188 U/L (ref 17–232)

## 2024-12-19 LAB — PSA: PSA: 9.32 ng/mL — ABNORMAL HIGH (ref 0.10–4.00)

## 2024-12-19 NOTE — Progress Notes (Signed)
 "  Subjective:     Patient ID: Jeremy Ewing, male    DOB: [birth year], 64 y.o.   MRN: [id number]  Chief Complaint  Patient presents with   Follow-up    Pt is here for follow up    Discussed the use of AI scribe software for clinical note transcription with the patient, who gave verbal consent to proceed.  History of Present Illness Jeremy Ewing is a 64 year old male who presents for follow-up regarding muscle weakness and elevated liver enzymes.  The weakness in his legs has resolved since his last visit. He feels as energetic as a 'thirteen year old kid' and is walking fast without pain. He attributes some improvement to taking multivitamins purchased from [place] [facility].  He has a history of elevated liver enzymes and has reduced his alcohol consumption to about three shots and one or two beers per day. His thiamine  and vitamin D  levels were very low, with thiamine  being 'not even measurable' and vitamin D  at 8. He has been prescribed thiamine  and vitamin D  supplements, which he plans to pick up from [facility].  He recalls falling off a couch recently and previously falling off a ladder, but reports no significant injuries from these incidents. His muscle enzyme levels were high, with a recent test showing a level of 740. He is eating well and drinking water  No urinary symptoms such as difficulty urinating or straining, despite a high prostate-specific antigen (PSA) level. He reports a good urinary stream and complete bladder emptying.  He has a history of pancreatitis and a pancreatic cyst, which were previously evaluated with an ERCP. He mentions a past CT scan and is open to further imaging to monitor his condition.    Health Maintenance Due  Topic Date Due   Colonoscopy  04/05/2024    Past Medical History:  Diagnosis Date   GERD (gastroesophageal reflux disease)    Pancreatitis     Past Surgical History:  Procedure Laterality Date   BILIARY STENT PLACEMENT N/A 10/09/2022    Procedure: BILIARY STENT PLACEMENT;  Surgeon: Saintclair Jasper, MD;  Location: [facility];  Service: Gastroenterology;  Laterality: N/A;   ERCP N/A 10/09/2022   Procedure: ENDOSCOPIC RETROGRADE CHOLANGIOPANCREATOGRAPHY (ERCP);  Surgeon: Saintclair Jasper, MD;  Location: [facility];  Service: Gastroenterology;  Laterality: N/A;   IR RADIOLOGIST EVAL & MGMT  09/20/2019   IR RADIOLOGIST EVAL & MGMT  10/04/2019   LEG SURGERY Left 1998   pin hip/ femur and tibia   REMOVAL OF STONES  10/09/2022   Procedure: REMOVAL OF STONES;  Surgeon: Saintclair Jasper, MD;  Location: [facility];  Service: Gastroenterology;;   ANNETT  10/09/2022   Procedure: SPHINCTEROTOMY;  Surgeon: Saintclair Jasper, MD;  Location: [facility];  Service: Gastroenterology;;   TOE 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 HEAD EYES EARS NOSE THROAT: Normocephalic,  atraumatic, conjunctiva not injected, sclera nonicteric. CARDIAC: Regular rate and rhythm, S1 S2 present, no murmur NECK: Supple, no thyromegaly, no nodes, no carotid bruits. LUNGS: Clear to auscultation bilaterally, no wheezes. ABDOMEN: Bowel sounds present, soft, non-tender, non-distended, no hepatosplenomegaly, no masses. EXTREMITIES: No edema. MUSCULOSKELETAL: No gross abnormalities. NEUROLOGICAL: Alert and oriented x3, cranial nerves II through XII intact. PSYCHIATRIC: Normal mood, good eye contact.    Disc labs   Assessment & Plan:  Weakness of both  legs -     CK  Elevated LFTs -     Comprehensive metabolic panel with GFR -     CT ABDOMEN PELVIS W CONTRAST; Future  ETOH abuse  Elevated PSA -     PSA  Pancreatic pseudocyst -     CT ABDOMEN PELVIS W CONTRAST; Future  Vitamin D  deficiency  Screening for colorectal cancer -     Ambulatory referral to Gastroenterology    Assessment and Plan Assessment & Plan Alcohol abuse with elevated liver function tests and thiamine  deficiency   Chronic alcohol abuse has led to elevated liver function tests and severe thiamine  deficiency, impacting neurological function. He reports reducing alcohol intake to three shots and one or two beers daily. Continued reduction is essential to prevent liver damage and further neurological issues. Gradually reduce alcohol intake to avoid withdrawal symptoms. Take thiamine  supplements indefinitely, for at least the next year. Regularly monitor liver function tests.  Abnormal muscle enzyme studies   Muscle enzyme levels are elevated at 740, indicating significant muscle breakdown, likely due to chronic alcohol use and vitamin deficiencies. No recent falls or injuries reported. Symptoms are improving, though autoimmune conditions remain a differential. Ensure adequate hydration and nutrition to support muscle health. Monitor muscle enzyme levels for improvement.  Vitamin D  deficiency   Severe vitamin D  deficiency with levels at 8 contributes to muscle weakness. Vitamin D  supplementation is necessary to address the deficiency and improve muscle function. Take vitamin D  supplement as prescribed.  Pancreatic pseudocyst, history of pancreatitis   He has a history of pancreatitis and pancreatic pseudocyst, with a previous ERCP performed. Monitoring is necessary to assess for recurrence or complications. A CT scan has been ordered to evaluate pancreatic status.  Elevated prostate specific antigen (PSA)   Elevated PSA levels are present without urinary symptoms  suggestive of benign prostatic hyperplasia. Prostate cancer remains a differential, requiring further evaluation. A repeated PSA test will confirm elevation. If PSA remains elevated, a referral to a urologist will be made for further evaluation.  General health maintenance   He is due for a colonoscopy, as the last one was performed over ten years ago with no polyps found. Routine screening is necessary, and a colonoscopy has been scheduled.     Return in about 4 weeks (around 01/16/2025) for elevated lft.  Jenkins CHRISTELLA Carrel, MD     [1]  Current Outpatient Medications:    acetaminophen  (TYLENOL ) 500 MG tablet, , Disp: , Rfl:    pantoprazole  (PROTONIX ) 40 MG tablet, Take 1 tablet (40 mg total) by mouth daily., Disp: 90 tablet, Rfl: 3   thiamine  (VITAMIN B-1) 100 MG tablet, Take 1 tablet (100 mg total) by mouth daily., Disp: 100 tablet, Rfl: 3   Vitamin D , Ergocalciferol , (DRISDOL ) 1.25 MG (50000 UNIT) CAPS capsule, Take 1 capsule (50,000 Units total) by mouth every 7 (seven) days., Disp: 12 capsule, Rfl: 1 [2] No Known Allergies  "

## 2024-12-19 NOTE — Patient Instructions (Signed)
 Get vitamins from [facility]-prescription- 2 of them

## 2024-12-19 NOTE — Progress Notes (Signed)
 Liver tests are some better.  Continue working on cutting back the alcohol The muscle test are SO much better!  The PSA is still elevated.  Please do referral to urology-external-[facility].

## 2024-12-21 ENCOUNTER — Telehealth: Payer: Self-pay

## 2024-12-21 ENCOUNTER — Encounter: Payer: Self-pay | Admitting: Family Medicine

## 2024-12-21 NOTE — Telephone Encounter (Signed)
 Copied from CRM [id number]. Topic: General - Call Back - No Documentation >> Dec 21, 2024  2:52 PM Rea C wrote: Reason for CRM: Pt called back to speak with [facility] but relayed Dr. Enos note. Patient stated that he is awaiting to hear back from [facility] to schedule an appointment.   But, patient would like to receive a call back to go over the PSA numbers again.   [phone] (M)   Called pt and we discussed

## 2024-12-21 NOTE — Telephone Encounter (Signed)
 Copied from CRM [id number]. Topic: Clinical - Lab/Test Results >> Dec 20, 2024  3:20 PM Robinson H wrote: Reason for CRM: Patient states he wants to know what results and reasoning that he needs to see a Urologist, upset stating he's not wearing diapers and can use the restroom on his ow,. States why that wasn't found when the office took his blood the other day.Wants some clarification, also made a comment that office was trying to milk his Medicaid since wasn't nothing wrong with him.  Garin [phone]

## 2024-12-26 ENCOUNTER — Telehealth: Payer: Self-pay | Admitting: Family Medicine

## 2024-12-26 NOTE — Telephone Encounter (Signed)
 Copied from CRM #[id number]. Topic: General - Other >> Dec 26, 2024  9:39 AM Zebedee SAUNDERS wrote: Reason for CRM: Received call from [facility] per [facility] ph: [phone] cancel appt due to no prior authorization. CT ab pelvis scan.

## 2024-12-27 ENCOUNTER — Inpatient Hospital Stay: Admission: RE | Admit: 2024-12-27 | Source: Ambulatory Visit

## 2024-12-31 ENCOUNTER — Ambulatory Visit: Admitting: Family Medicine

## 2025-01-16 ENCOUNTER — Ambulatory Visit: Admitting: Family Medicine
